# Patient Record
Sex: Male | Born: 1935 | Race: White | Hispanic: No | Marital: Married | Smoking: Former smoker
Health system: Southern US, Community
[De-identification: ages and names within clinical notes are randomized; demographics above are authoritative.]

## PROBLEM LIST (undated history)

## (undated) DIAGNOSIS — I509 Heart failure, unspecified: Secondary | ICD-10-CM

## (undated) DIAGNOSIS — D649 Anemia, unspecified: Secondary | ICD-10-CM

## (undated) DIAGNOSIS — I4891 Unspecified atrial fibrillation: Secondary | ICD-10-CM

## (undated) DIAGNOSIS — E785 Hyperlipidemia, unspecified: Secondary | ICD-10-CM

## (undated) DIAGNOSIS — I6529 Occlusion and stenosis of unspecified carotid artery: Secondary | ICD-10-CM

## (undated) DIAGNOSIS — I2510 Atherosclerotic heart disease of native coronary artery without angina pectoris: Secondary | ICD-10-CM

## (undated) DIAGNOSIS — C801 Malignant (primary) neoplasm, unspecified: Secondary | ICD-10-CM

## (undated) DIAGNOSIS — I10 Essential (primary) hypertension: Secondary | ICD-10-CM

## (undated) DIAGNOSIS — K219 Gastro-esophageal reflux disease without esophagitis: Secondary | ICD-10-CM

## (undated) HISTORY — PX: TONSILLECTOMY AND ADENOIDECTOMY: SUR1326

## (undated) HISTORY — DX: Occlusion and stenosis of unspecified carotid artery: I65.29

## (undated) HISTORY — DX: Unspecified atrial fibrillation: I48.91

## (undated) HISTORY — DX: Malignant (primary) neoplasm, unspecified: C80.1

## (undated) HISTORY — DX: Heart failure, unspecified: I50.9

## (undated) HISTORY — DX: Gastro-esophageal reflux disease without esophagitis: K21.9

## (undated) HISTORY — DX: Anemia, unspecified: D64.9

## (undated) HISTORY — DX: Hyperlipidemia, unspecified: E78.5

## (undated) HISTORY — DX: Essential (primary) hypertension: I10

## (undated) HISTORY — DX: Atherosclerotic heart disease of native coronary artery without angina pectoris: I25.10

## (undated) HISTORY — PX: OTHER SURGICAL HISTORY: SHX169

## (undated) HISTORY — PX: SKIN CANCER EXCISION: SHX779

---
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
## 1992-10-23 HISTORY — PX: PROSTATE SURGERY: SHX751

## 2011-08-29 ENCOUNTER — Ambulatory Visit: Payer: Self-pay | Admitting: Internal Medicine

## 2011-09-08 ENCOUNTER — Ambulatory Visit: Payer: Self-pay | Admitting: Internal Medicine

## 2011-09-19 ENCOUNTER — Ambulatory Visit: Payer: Self-pay | Admitting: Internal Medicine

## 2012-03-19 ENCOUNTER — Other Ambulatory Visit: Payer: Self-pay | Admitting: Internal Medicine

## 2012-03-19 NOTE — Telephone Encounter (Signed)
New Patient appt on June 19th.  Please advise.

## 2012-04-10 ENCOUNTER — Encounter: Payer: Self-pay | Admitting: Internal Medicine

## 2012-04-10 ENCOUNTER — Ambulatory Visit (INDEPENDENT_AMBULATORY_CARE_PROVIDER_SITE_OTHER): Payer: Medicare Other | Admitting: Internal Medicine

## 2012-04-10 VITALS — BP 112/72 | HR 107 | Temp 98.0°F | Resp 14 | Wt 153.8 lb

## 2012-04-10 DIAGNOSIS — I4891 Unspecified atrial fibrillation: Secondary | ICD-10-CM

## 2012-04-10 DIAGNOSIS — I10 Essential (primary) hypertension: Secondary | ICD-10-CM | POA: Insufficient documentation

## 2012-04-10 DIAGNOSIS — E785 Hyperlipidemia, unspecified: Secondary | ICD-10-CM

## 2012-04-10 DIAGNOSIS — Z79899 Other long term (current) drug therapy: Secondary | ICD-10-CM

## 2012-04-10 MED ORDER — DILTIAZEM HCL ER 120 MG PO CP12: 120.0000 mg | ORAL_CAPSULE | Freq: Two times a day (BID) | ORAL | Status: DC

## 2012-04-10 NOTE — Patient Instructions (Addendum)
If you have another episode of nausea, we should rule out  UTI Boyd Kerbs should be able to obtain a UA/Culture  Return in 6 months

## 2012-04-10 NOTE — Progress Notes (Signed)
Patient ID: Bruce Vaughan, male   DOB: [birth year], 78 y.o.   MRN: [id number]  Patient Active Problem List  Diagnosis  . Hyperlipidemia  . Hypertension  . Atrial fibrillation    Subjective:  CC:   Chief Complaint  Patient presents with  . New Patient    HPI:   Bruce Vaughan is a 78 y.o. male who presents as a new patient to establish primary care .  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  Has OA and kyphosis , uses a walker for rest.  NO recent falls,  no history of recent fractures, last fall occurred while vacationing in [place] last year and fractured a metatarsal and a finger when he fell off the curb. Has atrial fibrillation diagnosed 3 years ago , and monitors INR with weekly home checks , makes his own changes to coumadin changes.  His cardiologist is  in [place]  His currentdose is 4 mg daily .  Had 2 episodes of nocturnal emesis each accompanied by nausea, each resolved after one episode. m Ha snot hade any in weeks.  Takes a PPI.  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  Code status discussed , he wishes to remain  FULL CODE.  He has  stable carotid stenosis, with prior serials scans and surgery  offered but declined. On warfarin and statin.,  mitral valve .  Active ,  No exertional dyspnea with current heart rate. Does water aerobics 3 times weekly swims for 20 minutes 3 times weekly.    Past Medical History  Diagnosis Date  . Hyperlipidemia   . Hypertension   . Atrial fibrillation   . prostate Cancer     Past Surgical History  Procedure Date  . Thyroidectomy     subtotal ,  nonmalignant tumor  . Tonsillectomy and adenoidectomy   . Appendectomy 1930  . Prostate surgery 1994    radical, annual PSAs low    History reviewed. No pertinent family history.  History   Social History  . Marital Status: Married    Spouse Name: N/A    Number of Children: N/A  . Years of  Education: N/A   Occupational History  . Not on file.   Social History Main Topics  . Smoking status: Former Smoker    Types: Cigarettes    Quit date: 04/10/1982  . Smokeless tobacco: Never Used  . Alcohol Use: Yes  . Drug Use: No  . Sexually Active: Not on file   Other Topics Concern  . Not on file   Social History Narrative  . No narrative on file         @ALLHX @    Review of Systems:   The remainder of the review of systems was negative except those addressed in the HPI.       Objective:  BP 112/72  Pulse 107  Temp 98 F (36.7 C) (Oral)  Resp 14  Wt 153 lb 12 oz (69.741 kg)  SpO2 99%  General appearance: alert, cooperative and appears stated age Ears: normal TM's and external ear canals both ears Throat: lips, mucosa, and tongue normal; teeth and gums normal Neck: no adenopathy, no carotid bruit, supple, symmetrical, trachea midline and thyroid not enlarged, symmetric, no tenderness/mass/nodules Back: symmetric, no curvature. ROM normal. No CVA tenderness. Lungs: clear to auscultation bilaterally Heart: regular rate and rhythm, S1, S2 normal, no murmur, click, rub or  gallop Abdomen: soft, non-tender; bowel sounds normal; no masses,  no organomegaly Pulses: 2+ and symmetric Skin: Skin color, texture, turgor normal. No rashes or lesions Lymph nodes: Cervical, supraclavicular, and axillary nodes normal.  Assessment and Plan:  Hypertension Well controlled on current regimen. Renal function stable, no changes today.  Atrial fibrillation We discucssed his rate control.  He is tolerating his current pulse rate without edema or exercise intolerance .  No changes today  Hyperlipidemia Managed with simvastatin. Marland Kitchen  He is due for LFTs  And repeat panel.  Last LDL was 66 in April 2012   Updated Medication List Outpatient Encounter Prescriptions as of 04/10/2012  Medication Sig Dispense Refill  . diltiazem (CARDIZEM SR) 120 MG 12 hr capsule Take 120 mg by  mouth daily.      . ferrous gluconate (FERGON) 246 (28 FE) MG tablet Take 246 mg by mouth daily with breakfast.      . hydrochlorothiazide (HYDRODIURIL) 25 MG tablet Take 25 mg by mouth daily.      Marland Kitchen lisinopril (PRINIVIL,ZESTRIL) 5 MG tablet Take 5 mg by mouth daily.      . Misc Natural Products (FIBER 7 PO) Take by mouth 2 (two) times daily.      . Multiple Vitamin (MULTIVITAMIN) tablet Take 1 tablet by mouth daily.      . niacin (SLO-NIACIN) 500 MG tablet Take 750 mg by mouth 2 (two) times daily with a meal.      . pantoprazole (PROTONIX) 40 MG tablet Take 40 mg by mouth daily.      . simvastatin (ZOCOR) 40 MG tablet Take 40 mg by mouth every evening.      . warfarin (COUMADIN) 4 MG tablet Take 4 mg by mouth daily.      Marland Kitchen diltiazem (CARDIZEM SR) 120 MG 12 hr capsule Take 1 capsule (120 mg total) by mouth 2 (two) times daily.  90 capsule  3     Orders Placed This Encounter  Procedures  . COMPLETE METABOLIC PANEL WITH GFR  . Direct LDL    No Follow-up on file.

## 2012-04-11 LAB — COMPLETE METABOLIC PANEL WITH GFR
ALT: 18 U/L (ref 0–53)
AST: 26 U/L (ref 0–37)
CO2: 28 mEq/L (ref 19–32)
Calcium: 10 mg/dL (ref 8.4–10.5)
Chloride: 100 mEq/L (ref 96–112)
GFR, Est African American: 60 mL/min
Potassium: 4.2 mEq/L (ref 3.5–5.3)
Sodium: 139 mEq/L (ref 135–145)
Total Protein: 7.2 g/dL (ref 6.0–8.3)

## 2012-04-13 ENCOUNTER — Encounter: Payer: Self-pay | Admitting: Internal Medicine

## 2012-04-13 DIAGNOSIS — I6529 Occlusion and stenosis of unspecified carotid artery: Secondary | ICD-10-CM | POA: Insufficient documentation

## 2012-04-13 NOTE — Assessment & Plan Note (Signed)
Managed with simvastatin. Bruce Vaughan  He is due for LFTs  And repeat panel.  Last LDL was 66 in April 2012

## 2012-04-13 NOTE — Assessment & Plan Note (Signed)
Well controlled on current regimen. Renal function stable, no changes today. 

## 2012-04-13 NOTE — Assessment & Plan Note (Signed)
We discucssed his rate control.  He is tolerating his current pulse rate without edema or exercise intolerance .  No changes today

## 2012-05-13 ENCOUNTER — Other Ambulatory Visit: Payer: Medicare Other | Admitting: *Deleted

## 2012-05-13 DIAGNOSIS — R17 Unspecified jaundice: Secondary | ICD-10-CM

## 2012-05-13 LAB — HEPATIC FUNCTION PANEL
ALT: 20 U/L (ref 0–53)
AST: 24 U/L (ref 0–37)
Albumin: 4.2 g/dL (ref 3.5–5.2)
Alkaline Phosphatase: 63 U/L (ref 39–117)
Total Bilirubin: 1.8 mg/dL — ABNORMAL HIGH (ref 0.3–1.2)

## 2012-06-07 ENCOUNTER — Telehealth: Payer: Self-pay | Admitting: Internal Medicine

## 2012-06-07 NOTE — Telephone Encounter (Signed)
If he is feeling fine, he does not need to see me,  Just have the EKG.  Have him come on Thursday afternoon when Morrie Sheldon is not so busy

## 2012-06-07 NOTE — Telephone Encounter (Signed)
Nurse visit scheduled.

## 2012-06-07 NOTE — Telephone Encounter (Signed)
Patient called and stated on his wife's AVS from last week you hand wrote on there for him to come back in and have an EKG done.  Do you want to see if in an office visit?  Please advise.

## 2012-06-13 ENCOUNTER — Other Ambulatory Visit: Payer: Self-pay | Admitting: Internal Medicine

## 2012-06-13 MED ORDER — SIMVASTATIN 40 MG PO TABS: 40.0000 mg | ORAL_TABLET | Freq: Every evening | ORAL | Status: AC

## 2012-06-20 ENCOUNTER — Ambulatory Visit (INDEPENDENT_AMBULATORY_CARE_PROVIDER_SITE_OTHER): Payer: Medicare Other | Admitting: Internal Medicine

## 2012-06-20 DIAGNOSIS — I4891 Unspecified atrial fibrillation: Secondary | ICD-10-CM

## 2012-08-01 ENCOUNTER — Telehealth: Payer: Self-pay | Admitting: Internal Medicine

## 2012-08-01 ENCOUNTER — Inpatient Hospital Stay: Payer: Self-pay | Admitting: Family Medicine

## 2012-08-01 LAB — CBC
HGB: 14.3 g/dL (ref 13.0–18.0)
MCHC: 35.3 g/dL (ref 32.0–36.0)
Platelet: 163 10*3/uL (ref 150–440)
WBC: 8.5 10*3/uL (ref 3.8–10.6)

## 2012-08-01 LAB — COMPREHENSIVE METABOLIC PANEL
Albumin: 4.1 g/dL (ref 3.4–5.0)
Alkaline Phosphatase: 87 U/L (ref 50–136)
Anion Gap: 11 (ref 7–16)
Calcium, Total: 9.2 mg/dL (ref 8.5–10.1)
Potassium: 3.6 mmol/L (ref 3.5–5.1)
SGOT(AST): 37 U/L (ref 15–37)
SGPT (ALT): 33 U/L (ref 12–78)
Sodium: 139 mmol/L (ref 136–145)
Total Protein: 7.8 g/dL (ref 6.4–8.2)

## 2012-08-01 LAB — PRO B NATRIURETIC PEPTIDE: B-Type Natriuretic Peptide: 14744 pg/mL — ABNORMAL HIGH (ref 0–450)

## 2012-08-01 LAB — URINALYSIS, COMPLETE
Ketone: NEGATIVE
Leukocyte Esterase: NEGATIVE
Ph: 6 (ref 4.5–8.0)
Protein: 100
RBC,UR: 1 /HPF (ref 0–5)

## 2012-08-01 LAB — CK TOTAL AND CKMB (NOT AT ARMC)
CK, Total: 86 U/L (ref 35–232)
CK-MB: 2.3 ng/mL (ref 0.5–3.6)
CK-MB: 2.4 ng/mL (ref 0.5–3.6)

## 2012-08-01 LAB — TROPONIN I
Troponin-I: 0.42 ng/mL — ABNORMAL HIGH
Troponin-I: 0.4 ng/mL — ABNORMAL HIGH

## 2012-08-01 LAB — PROTIME-INR: Prothrombin Time: 23.6 secs — ABNORMAL HIGH (ref 11.5–14.7)

## 2012-08-01 NOTE — Telephone Encounter (Signed)
Caller: Bruce Vaughan/Patient; Phone: [phone]; Reason for Call: Caller: Bruce Vaughan/Patient; Patient Name: Bruce Vaughan; PCP: Duncan Dull (Adults only); Best Callback Phone Number: [phone]; Reason for call: New onset of weakness and lightheadedness.  Pt.  Admits to mild Chest discomfort as well.  Pt.  Has hx.  Of A fib.  And takes coumadin.  No active bleeding.  Triaged per Weakness or Paralysis, and the Disposition became: 911 for Pressure, fullness, squeezing sensation or pain anywhere in the chest lasting 5 or more minutes now.  THE PATIENT REFUSED 911.  Pt.  Does agree to go to ED now, with his wife driving him.  Care instructions given.

## 2012-08-02 DIAGNOSIS — R5383 Other fatigue: Secondary | ICD-10-CM

## 2012-08-02 DIAGNOSIS — I059 Rheumatic mitral valve disease, unspecified: Secondary | ICD-10-CM

## 2012-08-02 DIAGNOSIS — R5381 Other malaise: Secondary | ICD-10-CM

## 2012-08-02 DIAGNOSIS — R7989 Other specified abnormal findings of blood chemistry: Secondary | ICD-10-CM

## 2012-08-02 LAB — CK TOTAL AND CKMB (NOT AT ARMC)
CK, Total: 82 U/L (ref 35–232)
CK-MB: 1.9 ng/mL (ref 0.5–3.6)

## 2012-08-02 LAB — LIPID PANEL
Cholesterol: 108 mg/dL (ref 0–200)
Ldl Cholesterol, Calc: 47 mg/dL (ref 0–100)

## 2012-08-03 DIAGNOSIS — I5041 Acute combined systolic (congestive) and diastolic (congestive) heart failure: Secondary | ICD-10-CM

## 2012-08-03 LAB — BASIC METABOLIC PANEL
Chloride: 99 mmol/L (ref 98–107)
Co2: 31 mmol/L (ref 21–32)
Creatinine: 1.45 mg/dL — ABNORMAL HIGH (ref 0.60–1.30)
EGFR (African American): 49 — ABNORMAL LOW
EGFR (Non-African Amer.): 42 — ABNORMAL LOW
Glucose: 99 mg/dL (ref 65–99)
Osmolality: 285 (ref 275–301)
Potassium: 3.1 mmol/L — ABNORMAL LOW (ref 3.5–5.1)

## 2012-08-03 LAB — TSH: Thyroid Stimulating Horm: 1.41 u[IU]/mL

## 2012-08-04 LAB — BASIC METABOLIC PANEL
Anion Gap: 9 (ref 7–16)
BUN: 30 mg/dL — ABNORMAL HIGH (ref 7–18)
Calcium, Total: 8.7 mg/dL (ref 8.5–10.1)
Co2: 28 mmol/L (ref 21–32)
Creatinine: 1.49 mg/dL — ABNORMAL HIGH (ref 0.60–1.30)
EGFR (African American): 48 — ABNORMAL LOW
EGFR (Non-African Amer.): 41 — ABNORMAL LOW
Glucose: 96 mg/dL (ref 65–99)
Osmolality: 287 (ref 275–301)

## 2012-08-05 ENCOUNTER — Telehealth: Payer: Self-pay | Admitting: Internal Medicine

## 2012-08-05 ENCOUNTER — Telehealth: Payer: Self-pay

## 2012-08-05 NOTE — Telephone Encounter (Signed)
TCM call I spoke with patient. He reports feeling better since hosp d/c for CHF exacerbation.  He denies worsening sob or edema. He continues to monitor coumadin with home machine and calls results to [facility] (used to manage him there).  Confirmed he has all meds needed per d/c list.  All meds have been filled/refilled. Appt made with G[facility]an for 08/08/12 at 11:15. Understanding verb.

## 2012-08-05 NOTE — Telephone Encounter (Signed)
TCM call

## 2012-08-05 NOTE — Telephone Encounter (Signed)
Patient was discharged from [facility] on 08/04/12.  I called and spoke with patient, he states he is feeling better and has to follow up with Dr. Mariah Milling on Thursday.  I asked him could we go ahead and make a follow up appointment with Dr. Darrick Huntsman and he asked that we wait a bit. He will call us back to schedule a follow up appointment.  He said that he went into the hospital with congestive heart failure.

## 2012-08-05 NOTE — Telephone Encounter (Signed)
Message copied by Marcelle Overlie on Mon Aug 05, 2012  9:29 AM ------      Message from: Oneida Arenas      Created: Mon Aug 05, 2012  9:22 AM      Regarding: tcm patient       Pt at [facility]

## 2012-08-08 ENCOUNTER — Ambulatory Visit (INDEPENDENT_AMBULATORY_CARE_PROVIDER_SITE_OTHER): Payer: Medicare Other | Admitting: Cardiovascular Disease

## 2012-08-08 ENCOUNTER — Encounter: Payer: Self-pay | Admitting: Cardiovascular Disease

## 2012-08-08 VITALS — BP 98/58 | HR 88 | Ht 74.0 in | Wt 153.0 lb

## 2012-08-08 DIAGNOSIS — I5041 Acute combined systolic (congestive) and diastolic (congestive) heart failure: Secondary | ICD-10-CM

## 2012-08-08 DIAGNOSIS — I4891 Unspecified atrial fibrillation: Secondary | ICD-10-CM

## 2012-08-08 DIAGNOSIS — E785 Hyperlipidemia, unspecified: Secondary | ICD-10-CM

## 2012-08-08 DIAGNOSIS — I2510 Atherosclerotic heart disease of native coronary artery without angina pectoris: Secondary | ICD-10-CM

## 2012-08-08 DIAGNOSIS — I10 Essential (primary) hypertension: Secondary | ICD-10-CM

## 2012-08-08 DIAGNOSIS — I6529 Occlusion and stenosis of unspecified carotid artery: Secondary | ICD-10-CM

## 2012-08-08 MED ORDER — FUROSEMIDE 20 MG PO TABS: 20.0000 mg | ORAL_TABLET | Freq: Every day | ORAL | Status: DC | PRN

## 2012-08-08 MED ORDER — DILTIAZEM HCL ER COATED BEADS 120 MG PO CP24: 120.0000 mg | ORAL_CAPSULE | Freq: Every day | ORAL | Status: DC

## 2012-08-08 MED ORDER — DIGOXIN 125 MCG PO TABS: 0.1250 mg | ORAL_TABLET | Freq: Every day | ORAL | Status: DC

## 2012-08-08 MED ORDER — POTASSIUM CHLORIDE ER 10 MEQ PO CPCR: 10.0000 meq | ORAL_CAPSULE | Freq: Every day | ORAL | Status: DC

## 2012-08-08 NOTE — Progress Notes (Signed)
Patient ID: Bruce Vaughan, male    DOB: 10/20/1921, 78 y.o.   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 He was diagnosed with acute systolic and diastolic CHF and poor heart rate control.  Digoxin was added for rate control, he was started on diuretics. Initial BNP was 14,000. Heart rate 100 to 120 on a regular basis, worse with exertion. With diuresis, his shortness of breath improved, lungs were clear and he was discharged with followup today.  Echocardiogram in the hospital showed ejection fraction 35-45%, mildly dilated left atrium, mild to moderate MR, mild to moderate TR, right ventricular systolic pressure 40-50 mmHg, moderate sized left pleural effusion he had baseline renal dysfunction in the hospital with creatinine 1.31 on arrival, troponin 0.4  Since his discharge, he reports that his fatigue is improved, no significant shortness of breath and he feels about his baseline. He is taking Lasix daily with low-dose potassium, digoxin daily. HCTZ was held. He denies any significant lightheadedness or dizziness but blood pressures running low  EKG in the hospital showed atrial fibrillation with right bundle branch block, left anterior fascicular block, rate 110 beats per minute  EKG today shows atrial fibrillation with rate 88 beats per minute, right bundle branch block, left anterior fascicular block, rare PVCs.     Outpatient Encounter Prescriptions as of 08/08/2012  Medication Sig Dispense Refill  . digoxin (LANOXIN) 0.125 MG tablet Take 1 tablet (0.125 mg total) by mouth daily.  90 tablet  3  . ferrous gluconate (FERGON) 324 MG tablet Take 324  mg by mouth daily with breakfast.      . furosemide (LASIX) 20 MG tablet Take 1 tablet (20 mg total) by mouth daily as needed.  90 tablet  3  . Misc Natural Products (FIBER 7 PO) Take by mouth daily.       . Multiple Vitamin (MULTIVITAMIN) tablet Take 1 tablet by mouth daily.      . niacin (NIASPAN) 750 MG CR tablet Take 750 mg by mouth at bedtime.      . niacin (NIASPAN) 750 MG CR tablet Take 750 mg by mouth daily.      . Nutritional Supplements (CHOICE DM FIBER-BURST PO) Take 1.5 g by mouth daily.      . pantoprazole (PROTONIX) 40 MG tablet Take 40 mg by mouth daily.      . potassium chloride (MICRO-K) 10 MEQ CR capsule Take 1 capsule (10 mEq total) by mouth daily.  90 capsule  4  . simvastatin (ZOCOR) 40 MG tablet Take 1 tablet (40 mg total) by mouth every evening.  90 tablet  3  . warfarin (COUMADIN) 4 MG tablet Take 4 mg by mouth daily.      Marland Kitchen DISCONTD: digoxin (LANOXIN) 0.125 MG tablet Take 0.125 mg by mouth daily.      Marland Kitchen DISCONTD: diltiazem (CARDIZEM SR) 120 MG 12 hr capsule Take 120 mg by mouth daily.      Marland Kitchen DISCONTD: furosemide (LASIX) 20 MG tablet Take 20 mg by mouth daily.      Marland Kitchen  lisinopril (PRINIVIL,ZESTRIL) 5 MG tablet Take 5 mg by mouth daily.      Marland Kitchen diltiazem (CARDIZEM CD) 120 MG 24 hr  capsule Take 1 capsule (120 mg total) by mouth daily.  90 capsule  3     Review of Systems  Constitutional: Negative.   HENT: Negative.   Eyes: Negative.   Respiratory: Negative.   Cardiovascular: Negative.   Gastrointestinal: Negative.   Musculoskeletal: Negative.   Skin: Negative.   Neurological: Negative.   Hematological: Negative.   Psychiatric/Behavioral: Negative.   All other systems reviewed and are negative.    BP 98/58  Pulse 88  Ht 6\' 2"  (1.88 m)  Wt 153 lb (69.4 kg)  BMI 19.64 kg/m2  Physical Exam  Nursing note and vitals reviewed. Constitutional: He is oriented to person, place, and time. He appears well-developed and well-nourished.  HENT:  Head: Normocephalic.    Nose: Nose normal.  Mouth/Throat: Oropharynx is clear and moist.  Eyes: Conjunctivae normal are normal. Pupils are equal, round, and reactive to light.  Neck: Normal range of motion. Neck supple. No JVD present.  Cardiovascular: Normal rate, regular rhythm, normal heart sounds and intact distal pulses.  Exam reveals no gallop and no friction rub.   No murmur heard. Pulmonary/Chest: Effort normal and breath sounds normal. No respiratory distress. He has no wheezes. He has no rales. He exhibits no tenderness.  Abdominal: Soft. Bowel sounds are normal. He exhibits no distension. There is no tenderness.  Musculoskeletal: Normal range of motion. He exhibits no edema and no tenderness.  Lymphadenopathy:    He has no cervical adenopathy.  Neurological: He is alert and oriented to person, place, and time. Coordination normal.  Skin: Skin is warm and dry. No rash noted. No erythema.  Psychiatric: He has a normal mood and affect. His behavior is normal. Judgment and thought content normal.           Assessment and Plan

## 2012-08-08 NOTE — Assessment & Plan Note (Signed)
Blood pressure is running low. We'll hold his lisinopril. No dizziness but he is concerned.

## 2012-08-08 NOTE — Assessment & Plan Note (Signed)
Chronic atrial fibrillation. Rate is improved on digoxin and diltiazem. We'll check a digoxin level today

## 2012-08-08 NOTE — Assessment & Plan Note (Signed)
Pleural effusion seems to have significantly improved with diuresis. Fatigue has improved. We will continue Lasix daily for now. He may benefit from repeat basic metabolic panel in the future. We have suggested he could skip doses of Lasix as needed for dry mouth, dry skin, low blood pressure or weight loss. He will take potassium only when he takes Lasix.

## 2012-08-08 NOTE — Patient Instructions (Addendum)
Please adjust lasix and potassium as you need Continue digoxin daily  Hold the lisinopril  We will check renal function, potassium and digoxin today  Please call us if you have new issues that need to be addressed before your next appt.  Your physician wants you to follow-up in: 6 weeks

## 2012-08-08 NOTE — Assessment & Plan Note (Signed)
Cholesterol is at goal on the current lipid regimen. No changes to the medications were made.  

## 2012-08-08 NOTE — Assessment & Plan Note (Signed)
Continue warfarin and statin. He's not interested in surgical options. We could discuss percutaneous stenting at a later date.

## 2012-08-09 LAB — BASIC METABOLIC PANEL
GFR calc Af Amer: 55 mL/min/{1.73_m2} — ABNORMAL LOW (ref >59)
GFR calc non Af Amer: 48 mL/min/{1.73_m2} — ABNORMAL LOW (ref >59)
Glucose: 127 mg/dL — ABNORMAL HIGH (ref 65–99)
Potassium: 4.6 mmol/L (ref 3.5–5.2)

## 2012-08-09 LAB — DIGOXIN LEVEL: Digoxin Level: 1.2 ng/mL (ref 0.9–2.0)

## 2012-08-12 ENCOUNTER — Encounter: Payer: Self-pay | Admitting: Internal Medicine

## 2012-08-12 ENCOUNTER — Ambulatory Visit (INDEPENDENT_AMBULATORY_CARE_PROVIDER_SITE_OTHER): Payer: Medicare Other | Admitting: Internal Medicine

## 2012-08-12 VITALS — BP 118/68 | HR 91 | Temp 97.6°F | Ht 73.0 in | Wt 152.0 lb

## 2012-08-12 DIAGNOSIS — Z79899 Other long term (current) drug therapy: Secondary | ICD-10-CM

## 2012-08-12 DIAGNOSIS — I5041 Acute combined systolic (congestive) and diastolic (congestive) heart failure: Secondary | ICD-10-CM

## 2012-08-12 DIAGNOSIS — I509 Heart failure, unspecified: Secondary | ICD-10-CM

## 2012-08-12 DIAGNOSIS — E785 Hyperlipidemia, unspecified: Secondary | ICD-10-CM

## 2012-08-12 DIAGNOSIS — I10 Essential (primary) hypertension: Secondary | ICD-10-CM

## 2012-08-12 DIAGNOSIS — I4891 Unspecified atrial fibrillation: Secondary | ICD-10-CM

## 2012-08-12 NOTE — Patient Instructions (Addendum)
If your potassium level is normal  (4.0) we will continue using potassium only when taking a dose of furosemide.

## 2012-08-12 NOTE — Progress Notes (Signed)
Patient ID: Bruce Vaughan, male   DOB: [birth year], 78 y.o.   MRN: [id number]  Patient Active Problem List  Diagnosis  . Hyperlipidemia  . Hypertension  . Atrial fibrillation  . Carotid stenosis, non-symptomatic  . Acute combined systolic and diastolic CHF, NYHA class 1    Subjective:  CC:   Chief Complaint  Patient presents with  . Follow-up    HPI:   Bruce Vaughan a 78 y.o. male who presents  With recent episode of decompensated systolic Congestive heart failure. 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  He presented with  symptoms of fatigue which were due to volume overload. He had tried to make contact with the office because he thought his hemoglobin may have dropped but had considerable difficulty making contact because the phone tree kept disconnecting him.  He was finally advised by the triage nurse to go to the ER at [facility] which he did and he was promptly admitted. His BNP was 14,000.  hemoglobin was normal. Chest x-ray showed pulmonary vascular congestion. Echo was done which showed an EF of 35% and a pleural effusion on the left. He was diuresed with IV Lasix. Lasix dose was reduced to 40 mg daily at discharge due to slight bump in creatinine. He had his hospital followup with Dr. Mariah Milling  recently and his medications were adjusted. He is taking digoxin and  Cardizem for rate control of his atrial fibrillation. He ruled out for acute MI.  He is weighing  self daily  Using lasix now prn now and supplementing potassium whenever he takes the Lasix. He has used Lasix twice in the last 4 days.  Renal function was back to baseline as of October 17, but his potassium was slightly elevated.  He remains very annoyed by the difficulty he had making contact with a person at our office when he needed to be seen..   Past Medical History  Diagnosis Date  . Hyperlipidemia   . Hypertension   . Atrial fibrillation   . prostate Cancer   . Carotid stenosis, non-symptomatic   .  Coronary artery disease     s/p attemp to place cardiac stent, however was not successful.  . Chronic anemia   . GERD (gastroesophageal reflux disease)     Past Surgical History  Procedure Date  . Thyroidectomy     subtotal ,  nonmalignant tumor  . Tonsillectomy and adenoidectomy   . Appendectomy 1930  . Prostate surgery 1994    radical, annual PSAs low  . Skin cancer excision   . Partial thyroid res          The following portions of the patient's history were reviewed and updated as appropriate: Allergies, current medications, and problem list.    Review of Systems:   12 Pt  review of systems was negative except those addressed in the HPI,     History   Social History  . Marital Status: Married    Spouse Name: N/A    Number of Children: N/A  . Years of Education: N/A   Occupational History  . Not on file.   Social History Main Topics  . Smoking status: Former Smoker    Types: Cigarettes    Quit date: 04/10/1982  . Smokeless tobacco: Never Used  . Alcohol Use: Yes  . Drug Use: No  . Sexually Active: Not on file   Other Topics Concern  . Not on file   Social History Narrative  . No narrative  on file    Objective:  BP 118/68  Pulse 91  Temp 97.6 F (36.4 C) (Oral)  Ht 6\' 1"  (1.854 m)  Wt 152 lb (68.947 kg)  BMI 20.05 kg/m2  SpO2 97%  General appearance: alert, cooperative and appears stated age Ears: normal TM's and external ear canals both ears Throat: lips, mucosa, and tongue normal; teeth and gums normal Neck: no adenopathy, no carotid bruit, supple, symmetrical, trachea midline and thyroid not enlarged, symmetric, no tenderness/mass/nodules Back: symmetric, no curvature. ROM normal. No CVA tenderness. Lungs: clear to auscultation bilaterally Heart: regular rate and rhythm, S1, S2 normal, no murmur, click, rub or gallop Abdomen: soft, non-tender; bowel sounds normal; no masses,  no organomegaly Pulses: 2+ and symmetric Skin: Skin  color, texture, turgor normal. No rashes or lesions Lymph nodes: Cervical, supraclavicular, and axillary nodes normal.  Assessment and Plan:  Hypertension His medications were adjusted last week for hypotension. His blood pressure is improved today. No changes. Repeat of renal function and potassium today.  Hyperlipidemia Managed with simvastatin and niacin. He has carotid stenosis, asymptomatic.  Acute combined systolic and diastolic CHF, NYHA class 1 He is on digoxin but is not on a beta blocker for unclear reasons. We'll discuss with his cardiologist whether we should make a switch from diltiazem to metoprolol given his concurrent atrial fibrillation.  Atrial fibrillation Currently rate controlled with diltiazem, but given his cardiomyopathy and recent hospitalization for heart failure question I discussed with him the need to put him on a beta blocker. He was reluctant to do this since he has been on diltiazem for years. Will discuss with Dr. Mariah Milling.    Updated Medication List Outpatient Encounter Prescriptions as of 08/12/2012  Medication Sig Dispense Refill  . digoxin (LANOXIN) 0.125 MG tablet Take 1 tablet (0.125 mg total) by mouth daily.  90 tablet  3  . diltiazem (CARDIZEM CD) 120 MG 24 hr capsule Take 1 capsule (120 mg total) by mouth daily.  90 capsule  3  . ferrous gluconate (FERGON) 324 MG tablet Take 324 mg by mouth daily with breakfast.      . furosemide (LASIX) 20 MG tablet Take 1 tablet (20 mg total) by mouth daily as needed.  90 tablet  3  . Misc Natural Products (FIBER 7 PO) Take by mouth daily.       . Multiple Vitamin (MULTIVITAMIN) tablet Take 1 tablet by mouth daily.      . niacin (NIASPAN) 750 MG CR tablet Take 750 mg by mouth at bedtime.      . niacin (NIASPAN) 750 MG CR tablet Take 750 mg by mouth daily.      . Nutritional Supplements (CHOICE DM FIBER-BURST PO) Take 1.5 g by mouth daily.      . pantoprazole (PROTONIX) 40 MG tablet Take 40 mg by mouth daily.       . potassium chloride (MICRO-K) 10 MEQ CR capsule Take 1 capsule (10 mEq total) by mouth daily.  90 capsule  4  . simvastatin (ZOCOR) 40 MG tablet Take 1 tablet (40 mg total) by mouth every evening.  90 tablet  3  . warfarin (COUMADIN) 4 MG tablet Take 4 mg by mouth daily.         Orders Placed This Encounter  Procedures  . Basic metabolic panel    No Follow-up on file.

## 2012-08-13 ENCOUNTER — Encounter: Payer: Self-pay | Admitting: Internal Medicine

## 2012-08-13 ENCOUNTER — Telehealth: Payer: Self-pay | Admitting: Internal Medicine

## 2012-08-13 LAB — BASIC METABOLIC PANEL
Calcium: 9.5 mg/dL (ref 8.4–10.5)
Creatinine, Ser: 1.3 mg/dL (ref 0.4–1.5)

## 2012-08-13 NOTE — Assessment & Plan Note (Signed)
He is on digoxin but is not on a beta blocker for unclear reasons. We'll discuss with his cardiologist whether we should make a switch from diltiazem to metoprolol given his concurrent atrial fibrillation.

## 2012-08-13 NOTE — Telephone Encounter (Signed)
Pt says he received a message that his lab work was in but it wouldn't let him click on it to view it. He was very concerned about his Potassium levels and was wanting to know if someone could send it again through My Chart.

## 2012-08-13 NOTE — Assessment & Plan Note (Signed)
His medications were adjusted last week for hypotension. His blood pressure is improved today. No changes. Repeat of renal function and potassium today.

## 2012-08-13 NOTE — Assessment & Plan Note (Signed)
Currently rate controlled with diltiazem, but given his cardiomyopathy and recent hospitalization for heart failure question I discussed with him the need to put him on a beta blocker. He was reluctant to do this since he has been on diltiazem for years. Will discuss with Dr. Mariah Milling.

## 2012-08-13 NOTE — Assessment & Plan Note (Signed)
Managed with simvastatin and niacin. He has carotid stenosis, asymptomatic.

## 2012-08-14 ENCOUNTER — Encounter: Payer: Self-pay | Admitting: Internal Medicine

## 2012-08-14 ENCOUNTER — Encounter: Payer: Self-pay | Admitting: Cardiovascular Disease

## 2012-08-15 NOTE — Progress Notes (Signed)
He is reluctant to change anything I tried in the hospital and in the office. As he does appear to be doing better, I did not push the topic. He is very hands-on with his care, including his medications Will try to manage with diuretics, added low-dose digoxin for rate control.

## 2012-08-16 ENCOUNTER — Telehealth: Payer: Self-pay | Admitting: *Deleted

## 2012-08-16 NOTE — Telephone Encounter (Signed)
lmtcb re: home INR testing Mylo Red RN

## 2012-08-16 NOTE — Telephone Encounter (Signed)
Pt aware of lab results. He has not taken any lasix in the past 4-5 days. He checks his INR every Monday and this week it was 3.0  Dr. Mariah Milling is aware Mylo Red RN

## 2012-08-19 ENCOUNTER — Encounter: Payer: Self-pay | Admitting: Cardiovascular Disease

## 2012-08-19 LAB — LAB REPORT - SCANNED: INR: 2.5

## 2012-08-20 ENCOUNTER — Telehealth: Payer: Self-pay | Admitting: Internal Medicine

## 2012-08-20 ENCOUNTER — Encounter: Payer: Self-pay | Admitting: Internal Medicine

## 2012-08-20 NOTE — Telephone Encounter (Signed)
Patient called in states that he has deactivated himself from [facility] Chart and requesting new code. I explained to patient that I could only mail or he could pick up activation code but that he couldn't get over the phone or email.  So I have generated a code for patient so he can reactivate himself.  He would like to know what labs need to be drawn to check his potassium and could we order those test for the nurse [facility] to draw. Please advise patient.

## 2012-08-20 NOTE — Telephone Encounter (Signed)
Letter on printer for BMET order to send to [facility] [place] [facility] [place]

## 2012-08-21 ENCOUNTER — Other Ambulatory Visit: Payer: Self-pay

## 2012-08-21 NOTE — Telephone Encounter (Signed)
Form faxed to [facility]  [phone].

## 2012-08-23 ENCOUNTER — Encounter: Payer: Self-pay | Admitting: Internal Medicine

## 2012-08-24 ENCOUNTER — Encounter: Payer: Self-pay | Admitting: Internal Medicine

## 2012-08-25 ENCOUNTER — Telehealth: Payer: Self-pay | Admitting: Internal Medicine

## 2012-08-26 ENCOUNTER — Encounter: Payer: Self-pay | Admitting: Cardiovascular Disease

## 2012-08-26 LAB — LAB REPORT - SCANNED: INR: 3.4

## 2012-08-28 ENCOUNTER — Ambulatory Visit (INDEPENDENT_AMBULATORY_CARE_PROVIDER_SITE_OTHER): Payer: Medicare Other | Admitting: Cardiovascular Disease

## 2012-08-28 DIAGNOSIS — I4891 Unspecified atrial fibrillation: Secondary | ICD-10-CM

## 2012-08-28 DIAGNOSIS — Z7901 Long term (current) use of anticoagulants: Secondary | ICD-10-CM

## 2012-08-29 ENCOUNTER — Encounter: Payer: Self-pay | Admitting: Internal Medicine

## 2012-09-02 ENCOUNTER — Telehealth: Payer: Self-pay | Admitting: Internal Medicine

## 2012-09-02 ENCOUNTER — Other Ambulatory Visit: Payer: Self-pay | Admitting: *Deleted

## 2012-09-02 ENCOUNTER — Other Ambulatory Visit: Payer: Self-pay

## 2012-09-02 MED ORDER — DIGOXIN 125 MCG PO TABS: 0.1250 mg | ORAL_TABLET | Freq: Every day | ORAL | Status: DC

## 2012-09-02 MED ORDER — HYDROCHLOROTHIAZIDE 25 MG PO TABS: 25.0000 mg | ORAL_TABLET | Freq: Every day | ORAL | Status: DC

## 2012-09-02 NOTE — Telephone Encounter (Signed)
Refilled Digoxin. 

## 2012-09-02 NOTE — Telephone Encounter (Signed)
Refill request for Hydrochlorothiazide 25 mg # 30 5 R sent electronic to [facility] pharmacy

## 2012-09-02 NOTE — Telephone Encounter (Signed)
Refill request for hydrochlorothiazide 25 mg tab Sig: take 1 tablet daily

## 2012-09-03 DIAGNOSIS — I10 Essential (primary) hypertension: Secondary | ICD-10-CM

## 2012-09-03 DIAGNOSIS — I6529 Occlusion and stenosis of unspecified carotid artery: Secondary | ICD-10-CM

## 2012-09-03 DIAGNOSIS — I509 Heart failure, unspecified: Secondary | ICD-10-CM

## 2012-09-03 DIAGNOSIS — I5041 Acute combined systolic (congestive) and diastolic (congestive) heart failure: Secondary | ICD-10-CM

## 2012-09-03 DIAGNOSIS — E785 Hyperlipidemia, unspecified: Secondary | ICD-10-CM

## 2012-09-03 DIAGNOSIS — I4891 Unspecified atrial fibrillation: Secondary | ICD-10-CM

## 2012-09-04 ENCOUNTER — Ambulatory Visit (INDEPENDENT_AMBULATORY_CARE_PROVIDER_SITE_OTHER): Payer: Medicare Other | Admitting: Cardiovascular Disease

## 2012-09-04 ENCOUNTER — Encounter: Payer: Self-pay | Admitting: Cardiovascular Disease

## 2012-09-04 DIAGNOSIS — Z7901 Long term (current) use of anticoagulants: Secondary | ICD-10-CM

## 2012-09-04 DIAGNOSIS — I4891 Unspecified atrial fibrillation: Secondary | ICD-10-CM

## 2012-09-10 ENCOUNTER — Encounter: Payer: Self-pay | Admitting: Cardiovascular Disease

## 2012-09-10 LAB — LAB REPORT - SCANNED: INR: 2.8

## 2012-09-11 ENCOUNTER — Ambulatory Visit (INDEPENDENT_AMBULATORY_CARE_PROVIDER_SITE_OTHER): Payer: Medicare Other | Admitting: Cardiovascular Disease

## 2012-09-11 DIAGNOSIS — I4891 Unspecified atrial fibrillation: Secondary | ICD-10-CM

## 2012-09-11 DIAGNOSIS — Z7901 Long term (current) use of anticoagulants: Secondary | ICD-10-CM

## 2012-09-12 ENCOUNTER — Other Ambulatory Visit: Payer: Self-pay | Admitting: *Deleted

## 2012-09-12 ENCOUNTER — Other Ambulatory Visit: Payer: Self-pay

## 2012-09-12 ENCOUNTER — Encounter: Payer: Self-pay | Admitting: Internal Medicine

## 2012-09-12 MED ORDER — DIGOXIN 125 MCG PO TABS: 0.1250 mg | ORAL_TABLET | Freq: Every day | ORAL | Status: AC

## 2012-09-12 NOTE — Telephone Encounter (Signed)
Refilled Digoxin. 

## 2012-09-14 ENCOUNTER — Encounter: Payer: Self-pay | Admitting: Cardiovascular Disease

## 2012-09-16 ENCOUNTER — Other Ambulatory Visit: Payer: Self-pay

## 2012-09-16 MED ORDER — DILTIAZEM HCL ER COATED BEADS 120 MG PO CP24: 120.0000 mg | ORAL_CAPSULE | Freq: Every day | ORAL | Status: DC

## 2012-09-17 ENCOUNTER — Ambulatory Visit (INDEPENDENT_AMBULATORY_CARE_PROVIDER_SITE_OTHER): Payer: Medicare Other | Admitting: Cardiovascular Disease

## 2012-09-17 ENCOUNTER — Encounter: Payer: Self-pay | Admitting: Cardiovascular Disease

## 2012-09-17 DIAGNOSIS — I4891 Unspecified atrial fibrillation: Secondary | ICD-10-CM

## 2012-09-17 DIAGNOSIS — Z7901 Long term (current) use of anticoagulants: Secondary | ICD-10-CM

## 2012-09-22 ENCOUNTER — Encounter: Payer: Self-pay | Admitting: Cardiovascular Disease

## 2012-09-23 ENCOUNTER — Encounter: Payer: Self-pay | Admitting: Cardiovascular Disease

## 2012-09-23 ENCOUNTER — Telehealth: Payer: Self-pay | Admitting: Internal Medicine

## 2012-09-23 LAB — CBC
HCT: 37.6 % — ABNORMAL LOW (ref 40.0–52.0)
MCV: 102 fL — ABNORMAL HIGH (ref 80–100)
Platelet: 150 10*3/uL (ref 150–440)
RBC: 3.67 10*6/uL — ABNORMAL LOW (ref 4.40–5.90)
RDW: 16 % — ABNORMAL HIGH (ref 11.5–14.5)
WBC: 6.9 10*3/uL (ref 3.8–10.6)

## 2012-09-23 LAB — COMPREHENSIVE METABOLIC PANEL
Alkaline Phosphatase: 91 U/L (ref 50–136)
Anion Gap: 8 (ref 7–16)
Bilirubin,Total: 1.7 mg/dL — ABNORMAL HIGH (ref 0.2–1.0)
Chloride: 111 mmol/L — ABNORMAL HIGH (ref 98–107)
Co2: 25 mmol/L (ref 21–32)
Creatinine: 1.22 mg/dL (ref 0.60–1.30)
EGFR (Non-African Amer.): 52 — ABNORMAL LOW
Osmolality: 296 (ref 275–301)
Potassium: 4 mmol/L (ref 3.5–5.1)
SGPT (ALT): 27 U/L (ref 12–78)
Sodium: 144 mmol/L (ref 136–145)
Total Protein: 7 g/dL (ref 6.4–8.2)

## 2012-09-23 LAB — URINALYSIS, COMPLETE
Bacteria: NONE SEEN
Ketone: NEGATIVE
Nitrite: NEGATIVE
Ph: 7 (ref 4.5–8.0)
Protein: 100
RBC,UR: 228 /HPF (ref 0–5)
Specific Gravity: 1.027 (ref 1.003–1.030)
Squamous Epithelial: <1

## 2012-09-23 LAB — PROTIME-INR: INR: 2.6

## 2012-09-23 LAB — TROPONIN I: Troponin-I: 0.35 ng/mL — ABNORMAL HIGH

## 2012-09-23 LAB — DIGOXIN LEVEL: Digoxin: 1.18 ng/mL

## 2012-09-23 NOTE — Telephone Encounter (Signed)
Patient  notified of normal potassium level via phone

## 2012-09-23 NOTE — Telephone Encounter (Signed)
His Potassium is 4.4 (normal)

## 2012-09-24 ENCOUNTER — Inpatient Hospital Stay: Payer: Self-pay | Admitting: Internal Medicine

## 2012-09-24 DIAGNOSIS — I5043 Acute on chronic combined systolic (congestive) and diastolic (congestive) heart failure: Secondary | ICD-10-CM

## 2012-09-24 DIAGNOSIS — I4891 Unspecified atrial fibrillation: Secondary | ICD-10-CM

## 2012-09-24 LAB — CBC WITH DIFFERENTIAL/PLATELET
Basophil #: 0.1 10*3/uL (ref 0.0–0.1)
Eosinophil %: 0.7 %
Lymphocyte #: 2 10*3/uL (ref 1.0–3.6)
Lymphocyte %: 30.9 %
MCH: 35 pg — ABNORMAL HIGH (ref 26.0–34.0)
MCHC: 34 g/dL (ref 32.0–36.0)
MCV: 103 fL — ABNORMAL HIGH (ref 80–100)
Monocyte %: 8.9 %
Neutrophil %: 58.7 %
Platelet: 154 10*3/uL (ref 150–440)
RBC: 3.8 10*6/uL — ABNORMAL LOW (ref 4.40–5.90)
RDW: 16.1 % — ABNORMAL HIGH (ref 11.5–14.5)
WBC: 6.6 10*3/uL (ref 3.8–10.6)

## 2012-09-24 LAB — BASIC METABOLIC PANEL
Calcium, Total: 9.4 mg/dL (ref 8.5–10.1)
EGFR (African American): >60
EGFR (Non-African Amer.): 58 — ABNORMAL LOW
Osmolality: 295 (ref 275–301)
Sodium: 144 mmol/L (ref 136–145)

## 2012-09-25 ENCOUNTER — Telehealth: Payer: Self-pay

## 2012-09-25 ENCOUNTER — Ambulatory Visit (INDEPENDENT_AMBULATORY_CARE_PROVIDER_SITE_OTHER): Payer: Medicare Other | Admitting: Cardiovascular Disease

## 2012-09-25 DIAGNOSIS — I4891 Unspecified atrial fibrillation: Secondary | ICD-10-CM

## 2012-09-25 DIAGNOSIS — Z7901 Long term (current) use of anticoagulants: Secondary | ICD-10-CM

## 2012-09-25 LAB — BASIC METABOLIC PANEL
Anion Gap: 6 — ABNORMAL LOW (ref 7–16)
BUN: 31 mg/dL — ABNORMAL HIGH (ref 7–18)
Calcium, Total: 9.3 mg/dL (ref 8.5–10.1)
EGFR (African American): 55 — ABNORMAL LOW
EGFR (Non-African Amer.): 47 — ABNORMAL LOW
Glucose: 106 mg/dL — ABNORMAL HIGH (ref 65–99)
Osmolality: 290 (ref 275–301)
Potassium: 3.6 mmol/L (ref 3.5–5.1)

## 2012-09-25 LAB — CBC WITH DIFFERENTIAL/PLATELET
Basophil #: 0.1 10*3/uL (ref 0.0–0.1)
Eosinophil %: 0.7 %
HGB: 13.9 g/dL (ref 13.0–18.0)
Lymphocyte %: 32.9 %
MCH: 35.5 pg — ABNORMAL HIGH (ref 26.0–34.0)
MCV: 103 fL — ABNORMAL HIGH (ref 80–100)
Monocyte %: 9.2 %
Neutrophil #: 4 10*3/uL (ref 1.4–6.5)
Platelet: 155 10*3/uL (ref 150–440)
RDW: 16.2 % — ABNORMAL HIGH (ref 11.5–14.5)

## 2012-09-25 NOTE — Telephone Encounter (Signed)
Pt d/c from [facility] today 12/4 Will make TCM call tomm

## 2012-09-25 NOTE — Telephone Encounter (Signed)
tcm

## 2012-09-26 ENCOUNTER — Encounter: Payer: Self-pay | Admitting: Cardiovascular Disease

## 2012-09-27 ENCOUNTER — Encounter: Payer: Self-pay | Admitting: Cardiovascular Disease

## 2012-09-27 NOTE — Telephone Encounter (Signed)
TCM call I spoke with pt's wife (pt was still sleeping) She says pt has been feeling well since d/c Denies worsening sob or edema Confirms compliance with meds and confirms he has all meds he needs from d/c  Says pt send [place] an email last night re: "feeling dry" and letting us know he held his lasix yesterday Pt wanting some parameters for when to take lasix/when to hold I told wife I would discuss with Dr. Mariah Milling and will call pt back She says ok to email him response instead  see email documentation for further info

## 2012-09-28 ENCOUNTER — Encounter: Payer: Self-pay | Admitting: Cardiovascular Disease

## 2012-10-02 ENCOUNTER — Ambulatory Visit (INDEPENDENT_AMBULATORY_CARE_PROVIDER_SITE_OTHER): Payer: Medicare Other | Admitting: Cardiovascular Disease

## 2012-10-02 ENCOUNTER — Encounter: Payer: Self-pay | Admitting: Cardiovascular Disease

## 2012-10-02 VITALS — BP 104/60 | HR 87 | Ht 74.0 in | Wt 156.0 lb

## 2012-10-02 DIAGNOSIS — I5041 Acute combined systolic (congestive) and diastolic (congestive) heart failure: Secondary | ICD-10-CM

## 2012-10-02 DIAGNOSIS — E785 Hyperlipidemia, unspecified: Secondary | ICD-10-CM

## 2012-10-02 DIAGNOSIS — I5020 Unspecified systolic (congestive) heart failure: Secondary | ICD-10-CM

## 2012-10-02 DIAGNOSIS — I2510 Atherosclerotic heart disease of native coronary artery without angina pectoris: Secondary | ICD-10-CM | POA: Insufficient documentation

## 2012-10-02 DIAGNOSIS — I10 Essential (primary) hypertension: Secondary | ICD-10-CM

## 2012-10-02 DIAGNOSIS — R079 Chest pain, unspecified: Secondary | ICD-10-CM

## 2012-10-02 DIAGNOSIS — R0602 Shortness of breath: Secondary | ICD-10-CM

## 2012-10-02 DIAGNOSIS — I4891 Unspecified atrial fibrillation: Secondary | ICD-10-CM

## 2012-10-02 DIAGNOSIS — I6529 Occlusion and stenosis of unspecified carotid artery: Secondary | ICD-10-CM

## 2012-10-02 MED ORDER — DILTIAZEM HCL 30 MG PO TABS: 30.0000 mg | ORAL_TABLET | Freq: Three times a day (TID) | ORAL | Status: AC

## 2012-10-02 NOTE — Assessment & Plan Note (Signed)
Cholesterol is at goal on the current lipid regimen. No changes to the medications were made.  

## 2012-10-02 NOTE — Assessment & Plan Note (Addendum)
Heart rate is relatively well controlled I'm concerned about his low blood pressure. 100 systolic today with periodic weakness and malaise. We'll decrease his diltiazem from extended release 120 mg daily down to 30 mg 3 times a day. Continue warfarin, digoxin. Other option for rate control if needed is amiodarone.

## 2012-10-02 NOTE — Patient Instructions (Signed)
Please alternate lasix 20 mg with 10 mg every other day Goal weight 151 pounds  Please hold the diltiazem 120 daily Start diltiazem 30 mg three times a day  We will check blood work today  Please call us if you have new issues that need to be addressed before your next appt.  Your physician wants you to follow-up in: 3 weeks

## 2012-10-02 NOTE — Assessment & Plan Note (Signed)
By his report, cardiac catheterization 15 years ago or more with significant stenoses at that time. Attempt at stent placement was unsuccessful. This is likely the reason of his depressed ejection fraction.

## 2012-10-02 NOTE — Progress Notes (Signed)
Patient ID: Bruce Vaughan, male    DOB: 27-Feb-1921, 78 y.o.   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 He was diagnosed with acute systolic and diastolic CHF and poor heart rate control.  Digoxin was added for rate control, he was started on diuretics. Initial BNP was 14,000. Heart rate 100 to 120 on a regular basis, worse with exertion. With diuresis and rate control, his shortness of breath improved, lungs were clear and he was discharged.  Echocardiogram in the hospital showed ejection fraction 35-45%, mildly dilated left atrium, mild to moderate MR, mild to moderate TR, right ventricular systolic pressure 40-50 mmHg, moderate sized left pleural effusion he had baseline renal dysfunction in the hospital with creatinine 1.31 on arrival, troponin 0.4  He was recently readmitted to the hospital at [facility] at the beginning of December  2013 for similar symptoms. He was only taking Lasix periodically based on his weight. He would take Lasix if weight was greater than 152 pounds. He was given IV diuretic in the hospital with significant improvement in his symptoms.  Heart rate in the hospital was improved with rate consistently less than 100.  In followup today, he reports that his energy waxes and wanes. It is "two thirds what it should be".  He has been taking Lasix daily with potassium every other day. Weight has been stable at 150.4 pounds. He denies any significant shortness of breath but does have malaise. Blood pressure today is 100/50, and again on repeat was low. He reports that blood pressure at home is sometimes in the 120 range. Blood work from the  hospital showed BUN 31, creatinine 1.32 on December 4 at the time of discharge when Lasix was held for one day and dose was decreased (changed from IV to low-dose by mouth). Hemoglobin greater than 12 at the time of discharge  EKG today shows atrial fibrillation with rate 88 beats per minute, right bundle branch block, left anterior fascicular block,  PVCs.     Outpatient Encounter Prescriptions as of 10/02/2012  Medication Sig Dispense Refill  . digoxin (LANOXIN) 0.125 MG tablet Take 1 tablet (0.125 mg total) by mouth daily.  30 tablet  3  . diltiazem (CARDIZEM CD) 120 MG 24 hr capsule Take 1 capsule (120 mg total) by mouth daily.  90 capsule  3  . ferrous gluconate (FERGON) 324 MG tablet Take 324 mg by mouth daily with breakfast.      . furosemide (LASIX) 20 MG tablet Take 1 tablet (20 mg total) by mouth daily as needed.  90 tablet  3  . hydrochlorothiazide (HYDRODIURIL) 25 MG tablet Take 1 tablet (25 mg total) by mouth daily.  30 tablet  5  . Misc Natural Products (FIBER 7 PO) Take by mouth daily.       . Multiple Vitamin (MULTIVITAMIN) tablet Take 1 tablet by mouth daily.      . niacin (NIASPAN) 750 MG CR tablet Take 750 mg by mouth at bedtime.      . niacin (NIASPAN) 750 MG CR tablet Take 750 mg by mouth daily.      . Nutritional Supplements (CHOICE DM FIBER-BURST PO) Take 1.5 g by mouth daily.      Marland Kitchen  pantoprazole (PROTONIX) 40 MG tablet Take 40 mg by mouth daily.      . potassium chloride (MICRO-K) 10 MEQ CR capsule Take 1 capsule (10 mEq total) by mouth daily.  90 capsule  4  . simvastatin (ZOCOR) 40 MG tablet Take 1 tablet (40 mg total) by mouth every evening.  90 tablet  3  . warfarin (COUMADIN) 4 MG tablet Take 4 mg by mouth daily.         Review of Systems  Constitutional: Positive for fatigue.  HENT: Negative.   Eyes: Negative.   Respiratory: Negative.   Cardiovascular: Negative.   Gastrointestinal: Negative.   Musculoskeletal: Negative.   Skin: Negative.    Neurological: Positive for weakness.  Hematological: Negative.   Psychiatric/Behavioral: Negative.   All other systems reviewed and are negative.    BP 104/60  Ht 6\' 2"  (1.88 m)  Wt 156 lb (70.761 kg)  BMI 20.03 kg/m2  Physical Exam  Nursing note and vitals reviewed. Constitutional: He is oriented to person, place, and time.       Thin, appears weak,  pale  HENT:  Head: Normocephalic.  Nose: Nose normal.  Mouth/Throat: Oropharynx is clear and moist.  Eyes: Conjunctivae normal are normal. Pupils are equal, round, and reactive to light.  Neck: Normal range of motion. Neck supple. No JVD present.  Cardiovascular: Normal rate, normal heart sounds and intact distal pulses.  An irregularly irregular rhythm present. Exam reveals no gallop and no friction rub.   No murmur heard. Pulmonary/Chest: Effort normal and breath sounds normal. No respiratory distress. He has no wheezes. He has no rales. He exhibits no tenderness.  Abdominal: Soft. Bowel sounds are normal. He exhibits no distension. There is no tenderness.  Musculoskeletal: Normal range of motion. He exhibits no edema and no tenderness.  Lymphadenopathy:    He has no cervical adenopathy.  Neurological: He is alert and oriented to person, place, and time. Coordination normal.  Skin: Skin is warm and dry. No rash noted. No erythema.  Psychiatric: He has a normal mood and affect. His behavior is normal. Judgment and thought content normal.           Assessment and Plan

## 2012-10-02 NOTE — Assessment & Plan Note (Signed)
Severe carotid arterial disease. He does not want intervention.

## 2012-10-02 NOTE — Assessment & Plan Note (Signed)
Blood pressure is low. We have suggested he hold lisinopril, we'll decrease the diltiazem.

## 2012-10-02 NOTE — Assessment & Plan Note (Signed)
Ischemic cardiomyopathy, underlying atrial fibrillation. We have suggested he take Lasix 20 mg alternating with 10 mg with goal weight 150.5 up to 151 pounds. Potassium every other day.

## 2012-10-05 LAB — BASIC METABOLIC PANEL
Creatinine, Ser: 1.33 mg/dL — ABNORMAL HIGH (ref 0.76–1.27)
GFR calc Af Amer: 54 mL/min/{1.73_m2} — ABNORMAL LOW (ref >59)
GFR calc non Af Amer: 47 mL/min/{1.73_m2} — ABNORMAL LOW (ref >59)
Glucose: 130 mg/dL — ABNORMAL HIGH (ref 65–99)

## 2012-10-06 ENCOUNTER — Encounter: Payer: Self-pay | Admitting: Cardiovascular Disease

## 2012-10-07 ENCOUNTER — Encounter: Payer: Self-pay | Admitting: Cardiovascular Disease

## 2012-10-08 ENCOUNTER — Telehealth: Payer: Self-pay | Admitting: Cardiovascular Disease

## 2012-10-08 ENCOUNTER — Other Ambulatory Visit: Payer: Self-pay

## 2012-10-08 DIAGNOSIS — I10 Essential (primary) hypertension: Secondary | ICD-10-CM

## 2012-10-08 NOTE — Telephone Encounter (Signed)
Pt calling concerning his weight and his lasix. Pt weight has gone up a Bruce Vaughan and his is taking 20 mg. Pt is wheezing in the am. Also has questions about his labs.

## 2012-10-08 NOTE — Telephone Encounter (Signed)
I discussed this with Dr. Mariah Milling. He suggests pt take an extra lasix 20 mg today and tomorrow and call us with weights. He also suggests we mention to pt the possibility of a CHF nurse to help him. I will call pt to explain.

## 2012-10-08 NOTE — Telephone Encounter (Signed)
I informed pt of Dr. Windell Hummingbird suggestion.  He verb. Understanding.  He will take potassium with the extra lasix today and tomm. He says he has appt with Dr. Darrick Huntsman 12/20 and would like potassium checked at this time. I told him I would put order in for them to check at that time. Understanding verb. Pt declines offer for CHF nurse

## 2012-10-08 NOTE — Telephone Encounter (Signed)
Pt says his weight is staying above 151 pounds. He is weighing 153 pounds. He is also c/o wheezing in mornings and LE edema Says wheezing "gets better throughout the day" but still has edema c/o "no energy" and worsening SOB He asks if Dr. Mariah Milling can see him today. I told him I would discuss with him and call him back. Understanding verb.

## 2012-10-09 ENCOUNTER — Ambulatory Visit (INDEPENDENT_AMBULATORY_CARE_PROVIDER_SITE_OTHER): Payer: Medicare Other | Admitting: Cardiovascular Disease

## 2012-10-09 ENCOUNTER — Encounter: Payer: Self-pay | Admitting: Internal Medicine

## 2012-10-09 DIAGNOSIS — Z7901 Long term (current) use of anticoagulants: Secondary | ICD-10-CM

## 2012-10-09 DIAGNOSIS — I4891 Unspecified atrial fibrillation: Secondary | ICD-10-CM

## 2012-10-11 ENCOUNTER — Ambulatory Visit (INDEPENDENT_AMBULATORY_CARE_PROVIDER_SITE_OTHER): Payer: Medicare Other | Admitting: Internal Medicine

## 2012-10-11 ENCOUNTER — Encounter: Payer: Self-pay | Admitting: Internal Medicine

## 2012-10-11 VITALS — BP 110/64 | HR 94 | Temp 97.4°F | Resp 12 | Ht 74.0 in | Wt 149.8 lb

## 2012-10-11 DIAGNOSIS — Z1331 Encounter for screening for depression: Secondary | ICD-10-CM

## 2012-10-11 DIAGNOSIS — I509 Heart failure, unspecified: Secondary | ICD-10-CM

## 2012-10-11 DIAGNOSIS — I5041 Acute combined systolic (congestive) and diastolic (congestive) heart failure: Secondary | ICD-10-CM

## 2012-10-11 DIAGNOSIS — I4891 Unspecified atrial fibrillation: Secondary | ICD-10-CM

## 2012-10-11 MED ORDER — FUROSEMIDE 20 MG PO TABS: 20.0000 mg | ORAL_TABLET | Freq: Every day | ORAL | Status: DC | PRN

## 2012-10-11 NOTE — Patient Instructions (Addendum)
If your weight increases by  Over 1.5 lbs  overnight , take 40 mg of lasix.   Anytime you take 40 mg of lasix, take a potassium supplement  Your ejection fraction is 35 to 45%, which is low ,  And is complicated by your irregular heart beat

## 2012-10-11 NOTE — Progress Notes (Signed)
Patient ID: Bruce Vaughan, male   DOB: [birth year], 78 y.o.   MRN: [id number]  Patient Active Problem List  Diagnosis  . Hyperlipidemia  . Hypertension  . Atrial fibrillation  . Carotid stenosis, non-symptomatic  . Acute combined systolic and diastolic CHF, NYHA class 1  . Long term (current) use of anticoagulants  . CAD (coronary artery disease)    Subjective:  CC:   Chief Complaint  Patient presents with  . Follow-up    HPI:   Bruce Vaughan a 78 y.o. male who presents for follow up on chronic problems including congestive heart failure secondary to atrial fibrillation and systolic dysfunction, EF 35%. He was treated by Dossie Arbour with digoxin and lasix were started.  He has been adjusting his lasix dose based on daily weight measurements.  He has been told to alternate between 20 and 10 mg daily but has noted his weight creping up gradually over his baseline of 151 lbs. He finds that his level of energy varies a lot.  His diltiazem was changed to short acting by Dr. Mariah Milling.  He has been waking up with a dry mouth but sleeps with his mouth open and does not snore.     Past Medical History  Diagnosis Date  . Hyperlipidemia   . Hypertension   . Atrial fibrillation   . prostate Cancer   . Carotid stenosis, non-symptomatic   . Coronary artery disease     s/p attemp to place cardiac stent, however was not successful.  . Chronic anemia   . GERD (gastroesophageal reflux disease)   . CHF (congestive heart failure)     Past Surgical History  Procedure Date  . Thyroidectomy     subtotal ,  nonmalignant tumor  . Tonsillectomy and adenoidectomy   . Appendectomy 1930  . Prostate surgery 1994    radical, annual PSAs low  . Skin cancer excision   . Partial thyroid res          The following portions of the patient's history were reviewed and updated as appropriate: Allergies, current medications, and problem list.    Review of Systems:   12 Pt  review of systems was  negative except those addressed in the HPI,     History   Social History  . Marital Status: Married    Spouse Name: N/A    Number of Children: N/A  . Years of Education: N/A   Occupational History  . Not on file.   Social History Main Topics  . Smoking status: Former Smoker    Types: Cigarettes    Quit date: 04/10/1982  . Smokeless tobacco: Never Used  . Alcohol Use: Yes  . Drug Use: No  . Sexually Active: Not on file   Other Topics Concern  . Not on file   Social History Narrative  . No narrative on file    Objective:  BP 110/64  Pulse 94  Temp 97.4 F (36.3 C) (Oral)  Resp 12  Ht 6\' 2"  (1.88 m)  Wt 149 lb 12 oz (67.926 kg)  BMI 19.23 kg/m2  SpO2 93%  General appearance: alert, cooperative and appears stated age Ears: normal TM's and external ear canals both ears Throat: lips, mucosa, and tongue normal; teeth and gums normal Neck: no adenopathy, no carotid bruit, supple, symmetrical, trachea midline and thyroid not enlarged, symmetric, no tenderness/mass/nodules Back: symmetric, no curvature. ROM normal. No CVA tenderness. Lungs: clear to auscultation bilaterally Heart: regular rate and rhythm, S1, S2 normal, no  murmur, click, rub or gallop Abdomen: soft, non-tender; bowel sounds normal; no masses,  no organomegaly Pulses: 2+ and symmetric Skin: Skin color, texture, turgor normal. No rashes or lesions Lymph nodes: Cervical, supraclavicular, and axillary nodes normal.  Assessment and Plan:  Acute combined systolic and diastolic CHF, NYHA class 1 Spent 30 minutes discussing the nature of his heart failure including the effect of his arrhythmia on his decreaed EF and the overall impact on his energy level.  Discussed dietary sources of sodium to avoid.  Advised after  Today's visit to increase lasix to 40 mg daily for progressive weight gain until his weight is back down to 151 lbs, then resume 20 mg daily and have a BMET checked on Friday.     Atrial  fibrillation currently managed with cardizem and coumadin.    Updated Medication List Outpatient Encounter Prescriptions as of 10/11/2012  Medication Sig Dispense Refill  . digoxin (LANOXIN) 0.125 MG tablet Take 1 tablet (0.125 mg total) by mouth daily.  30 tablet  3  . diltiazem (CARDIZEM) 30 MG tablet Take 1 tablet (30 mg total) by mouth 3 (three) times daily.  90 tablet  6  . ferrous gluconate (FERGON) 324 MG tablet Take 324 mg by mouth 2 (two) times daily.       . furosemide (LASIX) 20 MG tablet Take 1 tablet (20 mg total) by mouth daily as needed.  90 tablet  3  . Misc Natural Products (FIBER 7 PO) Take by mouth 2 (two) times daily.       . Multiple Vitamin (MULTIVITAMIN) tablet Take 1 tablet by mouth daily.      . niacin (NIASPAN) 750 MG CR tablet Take 750 mg by mouth at bedtime.      . pantoprazole (PROTONIX) 40 MG tablet Take 40 mg by mouth daily.      . potassium chloride (MICRO-K) 10 MEQ CR capsule Take 10 mEq by mouth daily as needed.      . simvastatin (ZOCOR) 40 MG tablet Take 1 tablet (40 mg total) by mouth every evening.  90 tablet  3  . warfarin (COUMADIN) 4 MG tablet Take 4 mg by mouth as directed.       . [DISCONTINUED] furosemide (LASIX) 20 MG tablet Take 1 tablet (20 mg total) by mouth daily as needed.  90 tablet  3     No orders of the defined types were placed in this encounter.    Return in about 3 months (around 01/09/2013).

## 2012-10-13 ENCOUNTER — Encounter: Payer: Self-pay | Admitting: Internal Medicine

## 2012-10-13 ENCOUNTER — Encounter: Payer: Self-pay | Admitting: Cardiovascular Disease

## 2012-10-14 ENCOUNTER — Encounter: Payer: Self-pay | Admitting: Internal Medicine

## 2012-10-14 NOTE — Assessment & Plan Note (Addendum)
Advised after  Today's visit to increase lasix to 40 mg daily for progressive weight gain until his weight is back down to 151 lbs, then resume 20 mg daily and have a BMET checked on Friday.

## 2012-10-14 NOTE — Assessment & Plan Note (Signed)
currently managed with cardizem and coumadin.

## 2012-10-17 ENCOUNTER — Telehealth: Payer: Self-pay | Admitting: Internal Medicine

## 2012-10-17 ENCOUNTER — Encounter: Payer: Self-pay | Admitting: Internal Medicine

## 2012-10-17 NOTE — Telephone Encounter (Signed)
Bruce Vaughan, had to increase his lasix to 40 mg daily for a few days bc of wt gain when using 20 mg daily.Marland Kitchen  His GFR is starting to decline on the higher dose of daily lasix,   I am repeating BMET in one week  (Cr was 1.3, now 1.55)

## 2012-10-18 ENCOUNTER — Encounter: Payer: Self-pay | Admitting: Internal Medicine

## 2012-10-18 DIAGNOSIS — I509 Heart failure, unspecified: Secondary | ICD-10-CM

## 2012-10-18 NOTE — Telephone Encounter (Signed)
Chronic atrial fibrillation, we would never get him out. Not to mention he has refused every intervention offered. Other option for diuretics include torsemide which may be more effective, perhaps better absorbed. Or periodically using metolazone prn 2.5 up to 5 mg 30 minutes before Lasix

## 2012-10-18 NOTE — Telephone Encounter (Signed)
His admissions to the hospital for weakness and malaise have been very vague. Really uncertain if they have been secondary to diastolic CHF. Minimal doses of Lasix in the hospital with minimal urine output and weight loss, some tender loving care and he seems to improve. He seems to be getting weaker quite rapidly. Uncertain if he is interpreting his natural progression/weakness as being something else such as fluid.

## 2012-10-18 NOTE — Telephone Encounter (Signed)
The wt gain appears to be real , he is weighing daily and has gained wt on 10 and 20 mg daily doses.  I guess I was wondering if his GFR continues to decline with doses necessary to maintain baseline wt, would he be a candidate for cardioversion?

## 2012-10-19 ENCOUNTER — Encounter: Payer: Self-pay | Admitting: Internal Medicine

## 2012-10-19 MED ORDER — TORSEMIDE 10 MG PO TABS: 10.0000 mg | ORAL_TABLET | Freq: Two times a day (BID) | ORAL | Status: AC | PRN

## 2012-10-19 NOTE — Telephone Encounter (Signed)
Ok, thanks for the background and suggestions!

## 2012-10-21 ENCOUNTER — Telehealth: Payer: Self-pay | Admitting: Cardiovascular Disease

## 2012-10-21 ENCOUNTER — Other Ambulatory Visit: Payer: Self-pay

## 2012-10-21 ENCOUNTER — Telehealth: Payer: Self-pay | Admitting: Internal Medicine

## 2012-10-21 ENCOUNTER — Encounter: Payer: Self-pay | Admitting: Internal Medicine

## 2012-10-21 DIAGNOSIS — I4891 Unspecified atrial fibrillation: Secondary | ICD-10-CM

## 2012-10-21 NOTE — Telephone Encounter (Signed)
I discussed issue with Dr. Kirke Corin.  He suggests having pt increase torsemide to 20 mg daily x a few days and check labs Thursday 10/24/12 as planned.   I called pt and dtr to explain this. They verb understanding although pt wishes to have labs checked tomm. I explained diuretic may not have had a chance to take effect enough to check these levels.  He says he wants this checked "anyway and if necessary I will come back in on Thursday".  We will check labs for pt at his suggestion tomm.  He will let us know at that time how he is feeling from a CHF standpoint.

## 2012-10-21 NOTE — Telephone Encounter (Signed)
Patient is needing a hospital bed put in there apartment. Please fax a note to [facility] for a hospital bed .[facility] Fax # [phone].

## 2012-10-21 NOTE — Telephone Encounter (Signed)
Pt daughter calling states that pt has has an increase in weight and not feeling well. Dr Darrick Huntsman did change his medication at the request of Dr Mariah Milling. daughter  Wants to speak to nurse. Daughter cell [phone]

## 2012-10-21 NOTE — Telephone Encounter (Signed)
Pt notified that it is ok to take the low dose torsemide per Dr. Darrick Huntsman and was advised to check his PT/INR and Digoxin levels on Thursday.

## 2012-10-21 NOTE — Telephone Encounter (Signed)
Staff message sent to Dr. Darrick Huntsman

## 2012-10-21 NOTE — Telephone Encounter (Signed)
[name] says pt's diuretic was changed from furosemide to torsemide 20 mg.  Today was his first dose of torsemide 20 mg.  Dtr says pt's weight is 155 pounds (goal weight=151 pounds). Says pt is sob, has LE edema and is fatigued.  Also c/o decreased appetite.  Also says INR=4.6.  I advised to have pt take extra torsemide 20 mg today and see if this helps weight/symptoms.  Dtr is hesitant, afraid to give him a total of "3 doses" of torsemide. I explained, per Dr. Melina Schools last email to pt, she wrote torsemide for BID but only wanted him to take this once daily. Says she gave him extra pills in case he needed this.  Dtr says pt is going to follow the instructions on bottle.  I offered to work pt in with Dr. Kirke Corin tomm. Dr. Mariah Milling is out of town.   She asks if they should be communicating with Dr. Darrick Huntsman instead. I told dtr I would be glad to try to get in touch with Dr. Darrick Huntsman for pt and see if she has any other suggestions. I will then call dtr back. Understanding verb.

## 2012-10-21 NOTE — Telephone Encounter (Signed)
I spoke with Shanda Bumps with Dr. Melina Schools office. She explains she spoke with pt today as well re: ok to go ahead and start torsemide today and to have labs checked (PT/INR and dig levels) Thursday. She says ok for [place] to manage. If we see pt this week, we will go ahead and draw labs. I will call pt/dtr to inform.

## 2012-10-21 NOTE — Telephone Encounter (Signed)
i cannot get  A DME order of that nature authorized by Medicare without an office visit  To document need.

## 2012-10-22 ENCOUNTER — Other Ambulatory Visit: Payer: Medicare Other

## 2012-10-22 ENCOUNTER — Telehealth: Payer: Self-pay | Admitting: Internal Medicine

## 2012-10-22 ENCOUNTER — Telehealth: Payer: Self-pay

## 2012-10-22 ENCOUNTER — Inpatient Hospital Stay: Payer: Self-pay | Admitting: Family Medicine

## 2012-10-22 DIAGNOSIS — I4891 Unspecified atrial fibrillation: Secondary | ICD-10-CM

## 2012-10-22 LAB — CBC
HCT: 44.1 % (ref 40.0–52.0)
HGB: 15.2 g/dL (ref 13.0–18.0)
MCHC: 34.4 g/dL (ref 32.0–36.0)
RBC: 4.23 10*6/uL — ABNORMAL LOW (ref 4.40–5.90)
RDW: 16.8 % — ABNORMAL HIGH (ref 11.5–14.5)
WBC: 10.7 10*3/uL — ABNORMAL HIGH (ref 3.8–10.6)

## 2012-10-22 LAB — COMPREHENSIVE METABOLIC PANEL
Albumin: 3.4 g/dL (ref 3.4–5.0)
Anion Gap: 12 (ref 7–16)
Bilirubin,Total: 2.4 mg/dL — ABNORMAL HIGH (ref 0.2–1.0)
Calcium, Total: 9.2 mg/dL (ref 8.5–10.1)
Chloride: 99 mmol/L (ref 98–107)
Co2: 26 mmol/L (ref 21–32)
Creatinine: 2.14 mg/dL — ABNORMAL HIGH (ref 0.60–1.30)
Osmolality: 291 (ref 275–301)
SGOT(AST): 75 U/L — ABNORMAL HIGH (ref 15–37)
SGPT (ALT): 59 U/L (ref 12–78)
Total Protein: 6.9 g/dL (ref 6.4–8.2)

## 2012-10-22 LAB — CK TOTAL AND CKMB (NOT AT ARMC): CK-MB: 4 ng/mL — ABNORMAL HIGH (ref 0.5–3.6)

## 2012-10-22 LAB — PROTIME-INR
INR: 3.3
Prothrombin Time: 33.5 secs — ABNORMAL HIGH (ref 11.5–14.7)

## 2012-10-22 NOTE — Telephone Encounter (Signed)
Order faxed to Sherri's attention She confirms she received this and will have him transferred out to [facility]

## 2012-10-22 NOTE — Telephone Encounter (Signed)
I spoke with Dr. Darrick Huntsman. She agrees pt should go to [facility] for possible admission. I called pt's dtr and advised her of this.  She was able to let me speak with Roanna Raider (staff at [facility]) who says we need to fax order to send pt to [facility] and they will have him transported. I told her i would do this now.

## 2012-10-22 NOTE — Telephone Encounter (Signed)
I received t/c from pt's dtr, Bruce Vaughan, who says pt is worse this am. Says he spent night is the "nursing home" side of [place] last night. Says he has severe DOE, edema and weight is up another 1.5 pounds.  INR yesterday was 4.6. He held warfarin yesterday and this am. He is due to have digoxin level checked today but dtr says they areunable to bring him in d/t symptoms.  She asks if they can check this at [facility].   Dtr also tells me BMP was checked yesterday at Dr. Melina Schools request. She gave me verbal results:BUN=47, Creat=2.05, K=4.1, glucose=199, Na=136, chloride=94, CO2=26, calcium=9.8 Says pt held demadex this am b/c he is "afraid of interaction between this and digoxin.  She confirms compliance with digoxin and diltiazem this am.  BP=113/76, HR=62 irregular O2 sats this am on RA=88%, was placed on 2L oxygen via nasal canula, says nurse aucultated lungs this am and were "clear" Having trouble swallowing and is "getting harder to swallow his pills" Because of his worsening symptoms, increased creatinine and low O2 sats, i told dtr I would call Dr. Darrick Huntsman to see if she feels pt should be admitted. She verb. Understanding and feels this may be a good idea

## 2012-10-22 NOTE — Telephone Encounter (Signed)
Daughter called in states her dads health is declining fairly quickly, she states he is over 5.5 lbs over his weight goal that includes a one pound weight gain since yesterday.  Basic metabolic was drawn yesterday she states his renal function is out of wack a little, he isn't eating anything other than a small amount of juice and fruit, she states it is very difficult for him to swallow his pills, 113/76 was his vitals today, she states that he has stopped taking his blood pressure medication O2 88% they just started him on supplementary oxygen, Nurse Boyd Kerbs just listened to his lungs and she states they sounded clear, his legs are swollen. He doesn't want to take the new torfemide because it conflicts against his medication for a fib, he did take this twice yesterday, he does have an appointment today at the cardiologist at 2:00, at first she didn't think he would be able to take him since he is so windy, however since he stays at [facility] they provide him transportation. She states he isn't doing well and the past two days he has been in the nursing home part of [address] of [place]. His PT/INR was 4.6 yesterday he hasn't taken warfin for the past 2 days. She wanted to know if they should take him to the hospital since he isn't getting any better or if he should be set up with Hospice.  She is going to call the cardiologist office to see if he has to come in today for just blood work or if they can just draw his labs at the facility. I tried to transfer call to the triage line however I accidentally hung up on patient and tried calling her back so that I could transfer her and I left a voice mail. Daughter called back and I was successful at sending her to triage line.

## 2012-10-22 NOTE — Telephone Encounter (Signed)
Attempt x 2 to reach daughter. Left message.

## 2012-10-23 DIAGNOSIS — I059 Rheumatic mitral valve disease, unspecified: Secondary | ICD-10-CM

## 2012-10-23 LAB — CBC WITH DIFFERENTIAL/PLATELET
Basophil #: 0.1 10*3/uL (ref 0.0–0.1)
Basophil %: 0.5 %
Eosinophil #: 0 10*3/uL (ref 0.0–0.7)
Eosinophil %: 0.2 %
HGB: 14.3 g/dL (ref 13.0–18.0)
Lymphocyte #: 0.9 10*3/uL — ABNORMAL LOW (ref 1.0–3.6)
Lymphocyte %: 9.5 %
MCHC: 34.4 g/dL (ref 32.0–36.0)
MCV: 104 fL — ABNORMAL HIGH (ref 80–100)
Monocyte #: 0.7 x10 3/mm (ref 0.2–1.0)
Monocyte %: 7.4 %
Neutrophil #: 8.1 10*3/uL — ABNORMAL HIGH (ref 1.4–6.5)
Neutrophil %: 82.4 %
Platelet: 147 10*3/uL — ABNORMAL LOW (ref 150–440)
RBC: 4 10*6/uL — ABNORMAL LOW (ref 4.40–5.90)
RDW: 17 % — ABNORMAL HIGH (ref 11.5–14.5)
WBC: 9.8 10*3/uL (ref 3.8–10.6)

## 2012-10-23 LAB — BASIC METABOLIC PANEL
Calcium, Total: 9.1 mg/dL (ref 8.5–10.1)
Chloride: 100 mmol/L (ref 98–107)
Creatinine: 2.45 mg/dL — ABNORMAL HIGH (ref 0.60–1.30)
EGFR (Non-African Amer.): 22 — ABNORMAL LOW
Glucose: 140 mg/dL — ABNORMAL HIGH (ref 65–99)
Osmolality: 293 (ref 275–301)
Potassium: 4.3 mmol/L (ref 3.5–5.1)
Sodium: 138 mmol/L (ref 136–145)

## 2012-10-23 LAB — URINALYSIS, COMPLETE
Glucose,UR: NEGATIVE mg/dL (ref 0–75)
Hyaline Cast: 21
Ketone: NEGATIVE
Leukocyte Esterase: NEGATIVE
Nitrite: NEGATIVE
Ph: 5 (ref 4.5–8.0)
Specific Gravity: 1.02 (ref 1.003–1.030)
Squamous Epithelial: 1
WBC UR: 1 /HPF (ref 0–5)

## 2012-10-23 LAB — MAGNESIUM: Magnesium: 2.6 mg/dL — ABNORMAL HIGH

## 2012-10-23 LAB — CK TOTAL AND CKMB (NOT AT ARMC)
CK, Total: 92 U/L (ref 35–232)
CK-MB: 4 ng/mL — ABNORMAL HIGH (ref 0.5–3.6)

## 2012-10-23 LAB — PROTIME-INR: Prothrombin Time: 36.8 secs — ABNORMAL HIGH (ref 11.5–14.7)

## 2012-10-23 LAB — PROTEIN / CREATININE RATIO, URINE: Protein, Random Urine: 51 mg/dL — ABNORMAL HIGH (ref 0–12)

## 2012-10-24 ENCOUNTER — Ambulatory Visit: Payer: Self-pay | Admitting: Cardiovascular Disease

## 2012-10-24 DIAGNOSIS — Z7901 Long term (current) use of anticoagulants: Secondary | ICD-10-CM

## 2012-10-24 DIAGNOSIS — I4891 Unspecified atrial fibrillation: Secondary | ICD-10-CM

## 2012-10-24 LAB — RENAL FUNCTION PANEL
Albumin: 3.3 g/dL — ABNORMAL LOW (ref 3.4–5.0)
BUN: 58 mg/dL — ABNORMAL HIGH (ref 7–18)
Calcium, Total: 9.1 mg/dL (ref 8.5–10.1)
Co2: 26 mmol/L (ref 21–32)
Creatinine: 2.26 mg/dL — ABNORMAL HIGH (ref 0.60–1.30)
EGFR (African American): 29 — ABNORMAL LOW
EGFR (Non-African Amer.): 25 — ABNORMAL LOW
Glucose: 129 mg/dL — ABNORMAL HIGH (ref 65–99)
Osmolality: 292 (ref 275–301)
Phosphorus: 4.2 mg/dL (ref 2.5–4.9)
Potassium: 4.2 mmol/L (ref 3.5–5.1)
Sodium: 137 mmol/L (ref 136–145)

## 2012-10-24 LAB — URINE CULTURE

## 2012-10-24 LAB — PROTIME-INR
INR: 3.8
Prothrombin Time: 37.5 secs — ABNORMAL HIGH (ref 11.5–14.7)

## 2012-10-25 ENCOUNTER — Encounter: Payer: Self-pay | Admitting: Internal Medicine

## 2012-10-25 LAB — PROTEIN ELECTROPHORESIS(ARMC)

## 2012-10-25 LAB — UR PROT ELECTROPHORESIS, URINE RANDOM

## 2012-10-25 NOTE — Telephone Encounter (Signed)
Left message on patient home # vm of need to make appt. With Dr. Darrick Huntsman for getting a hospital bed.

## 2012-10-28 ENCOUNTER — Ambulatory Visit: Payer: Medicare Other | Admitting: Cardiovascular Disease

## 2012-10-30 ENCOUNTER — Ambulatory Visit: Payer: Medicare Other | Admitting: Cardiovascular Disease

## 2012-10-30 NOTE — Progress Notes (Signed)
Patient ID: Bruce Vaughan, male   DOB: 1921/08/28, 79 y.o.   MRN: [id number] I am sad to report that Mr. Ferrin died at [facility]. He arrested in the CT scanner. He had cath showing severe LAD and RCA disease.  Repeat echo confirming significant aortica valve stenosis. Resuscitation was unsuccessful.

## 2012-10-31 ENCOUNTER — Encounter: Payer: Self-pay | Admitting: Internal Medicine

## 2012-11-23 DEATH — deceased

## 2013-07-14 IMAGING — CT CT CHEST W/O CM
1 series · 16 of 32 positions shown, 20 images · non-contrast
Comparison: none

REASON FOR EXAM: consolidation on plain film
COMMENTS:

PROCEDURE:     CT  - CT CHEST WITHOUT CONTRAST  - September 23, 2012  [DATE]
RESULT:     History infiltrate.
Comparison Study: Chest x-ray

[Series 2: soft tissue · axial · 0.71mm/px · z∈[-422,-71]mm · 16 of 131 slices shown, 20 images]
[im 9/131  soft-tissue]
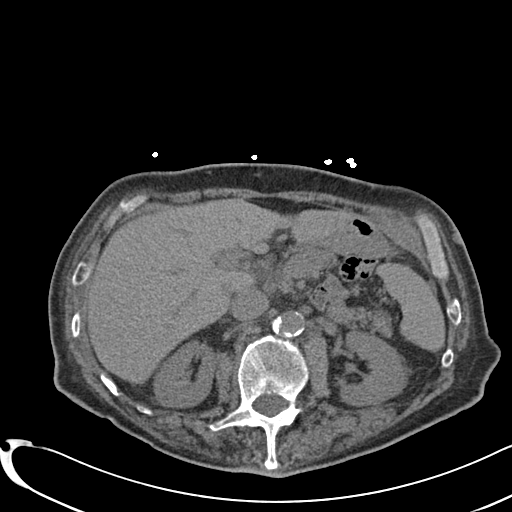
[im 9/131  bone]
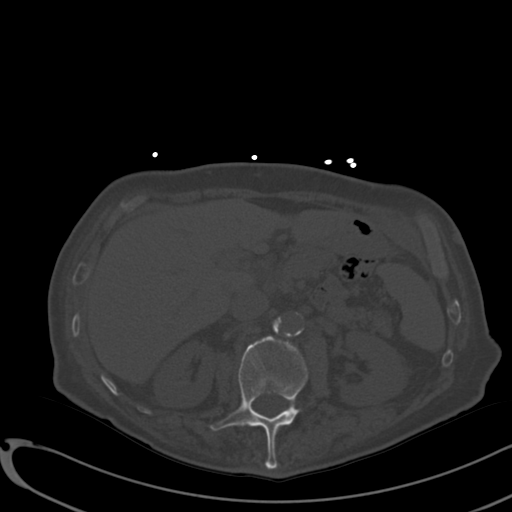
[im 17/131  soft-tissue]
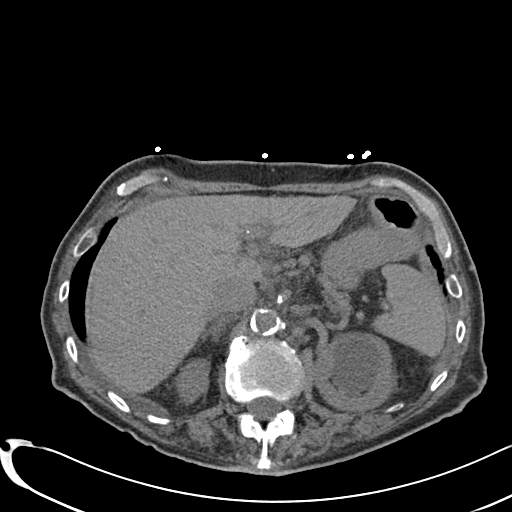
[im 26/131  soft-tissue]
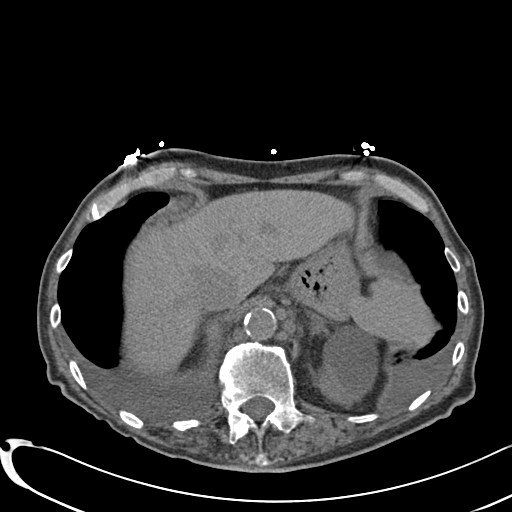
[im 34/131  soft-tissue]
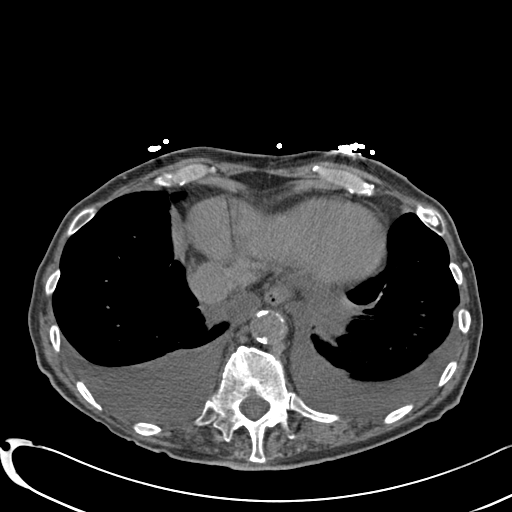
[im 42/131  soft-tissue]
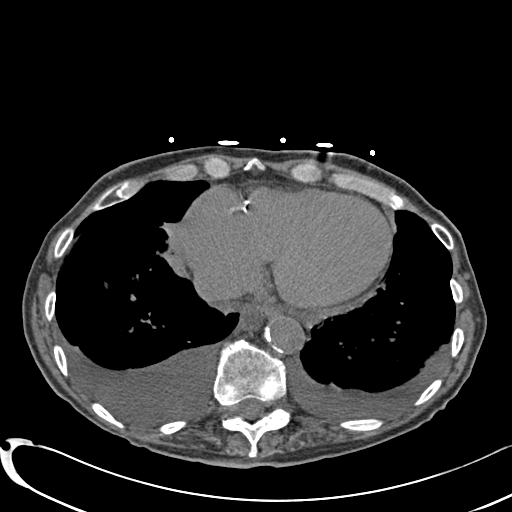
[im 51/131  soft-tissue]
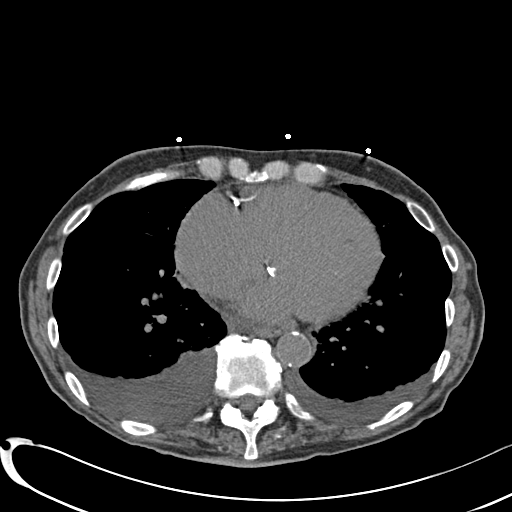
[im 59/131  soft-tissue]
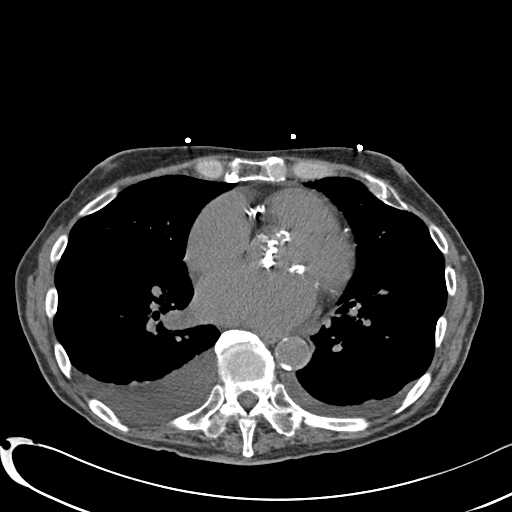
[im 72/131  soft-tissue]
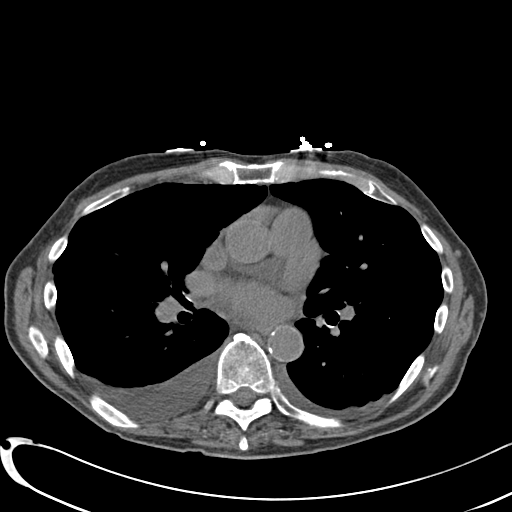
[im 80/131  soft-tissue]
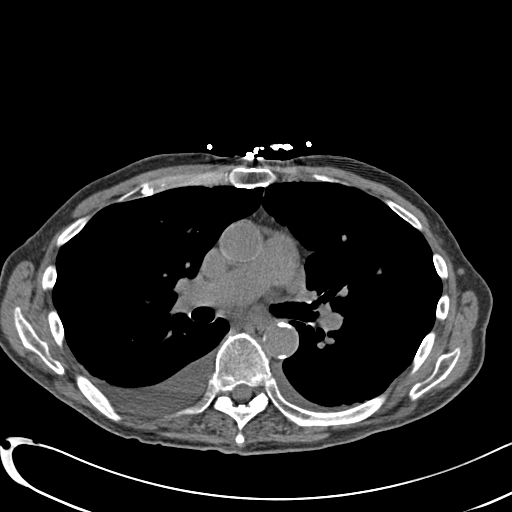
[im 80/131  bone]
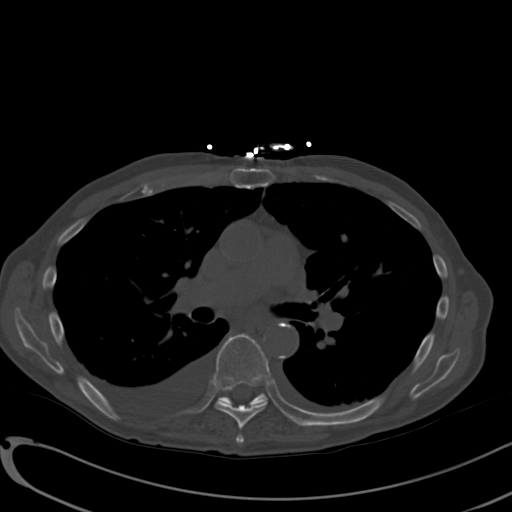
[im 89/131  soft-tissue]
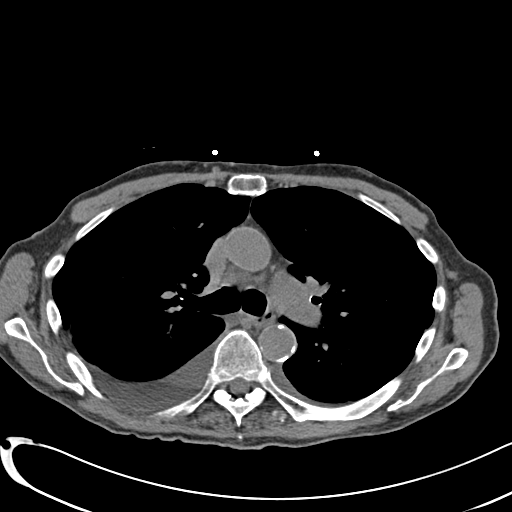
[im 97/131  soft-tissue]
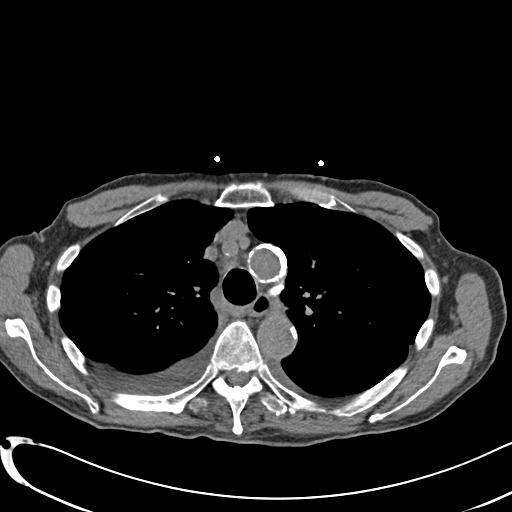
[im 105/131  soft-tissue]
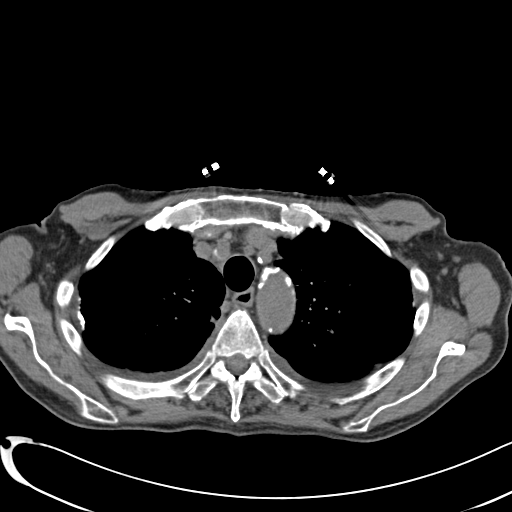
[im 114/131  soft-tissue]
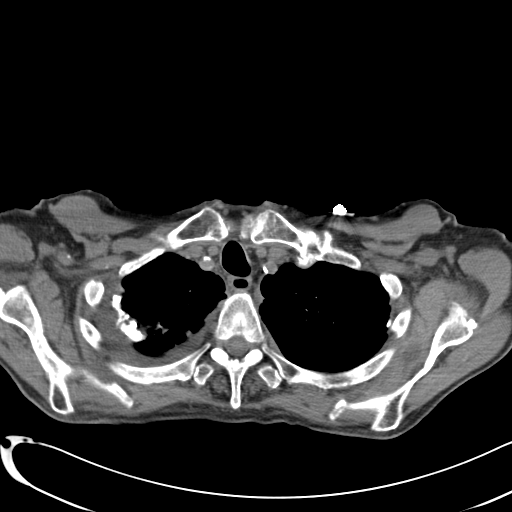
[im 114/131  lung]
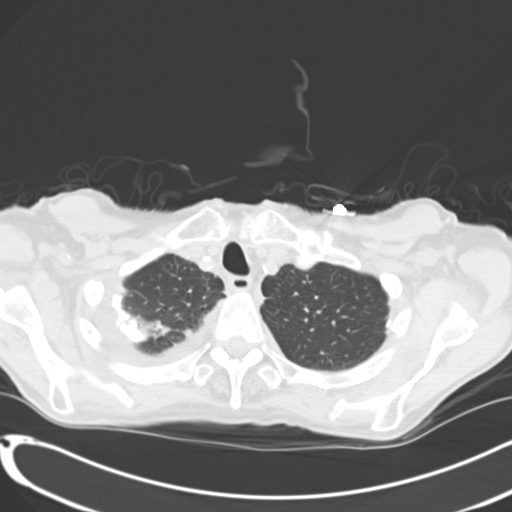
[im 118/131  lung]
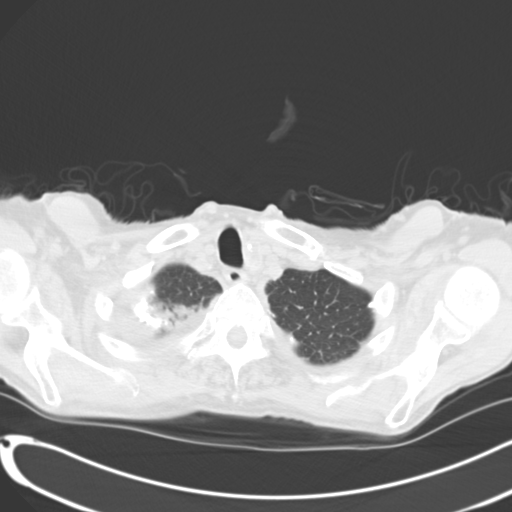
[im 122/131  soft-tissue]
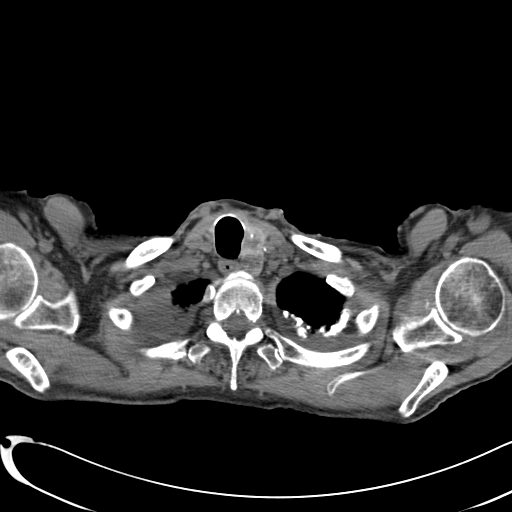
[im 122/131  lung]
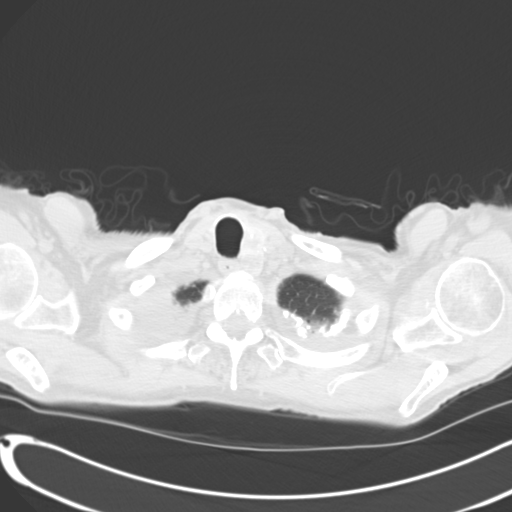
[im 126/131  lung]
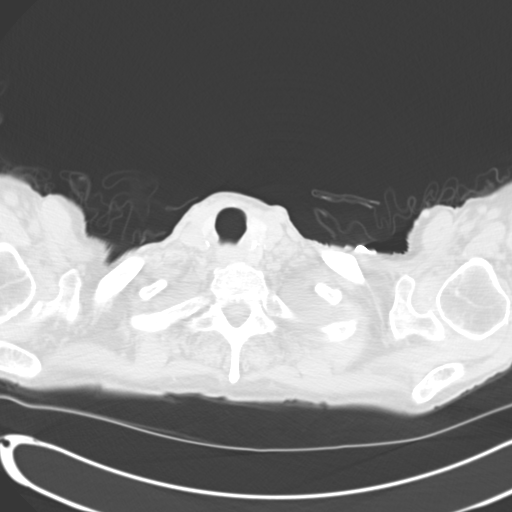

[16 of 32 positions shown; findings below may reference images not displayed]

FINDINGS: Standard nonenhanced CT obtained. Focal small mediastinal lymph
nodes noted. Coronary artery disease. Cardiomegaly. Bilateral pleural
effusions. Bilateral interstitial and alveolar changes in the lung bases
consistent with mild edema. Pneumonia could also present this fashion.
Adrenals are normal. Bilateral renal cysts. Large airways are patent. Apical
pleural thickening with calcification noted consistent with scarring. Prior
asbestos exposure cannot be excluded.
IMPRESSION: Congestive heart failure with pulmonary edema and bilateral
pleural effusions. Superimposed pneumonia cannot be excluded.

## 2013-07-14 IMAGING — CR DG CHEST 2V
1 series · 3 of 3 positions shown · non-contrast
Comparison: none

REASON FOR EXAM: weakness; mild hypoxia
COMMENTS:

[Series 1: x chest ap · 0.14mm/px · 3 of 3 slices shown]
[im 1/3]
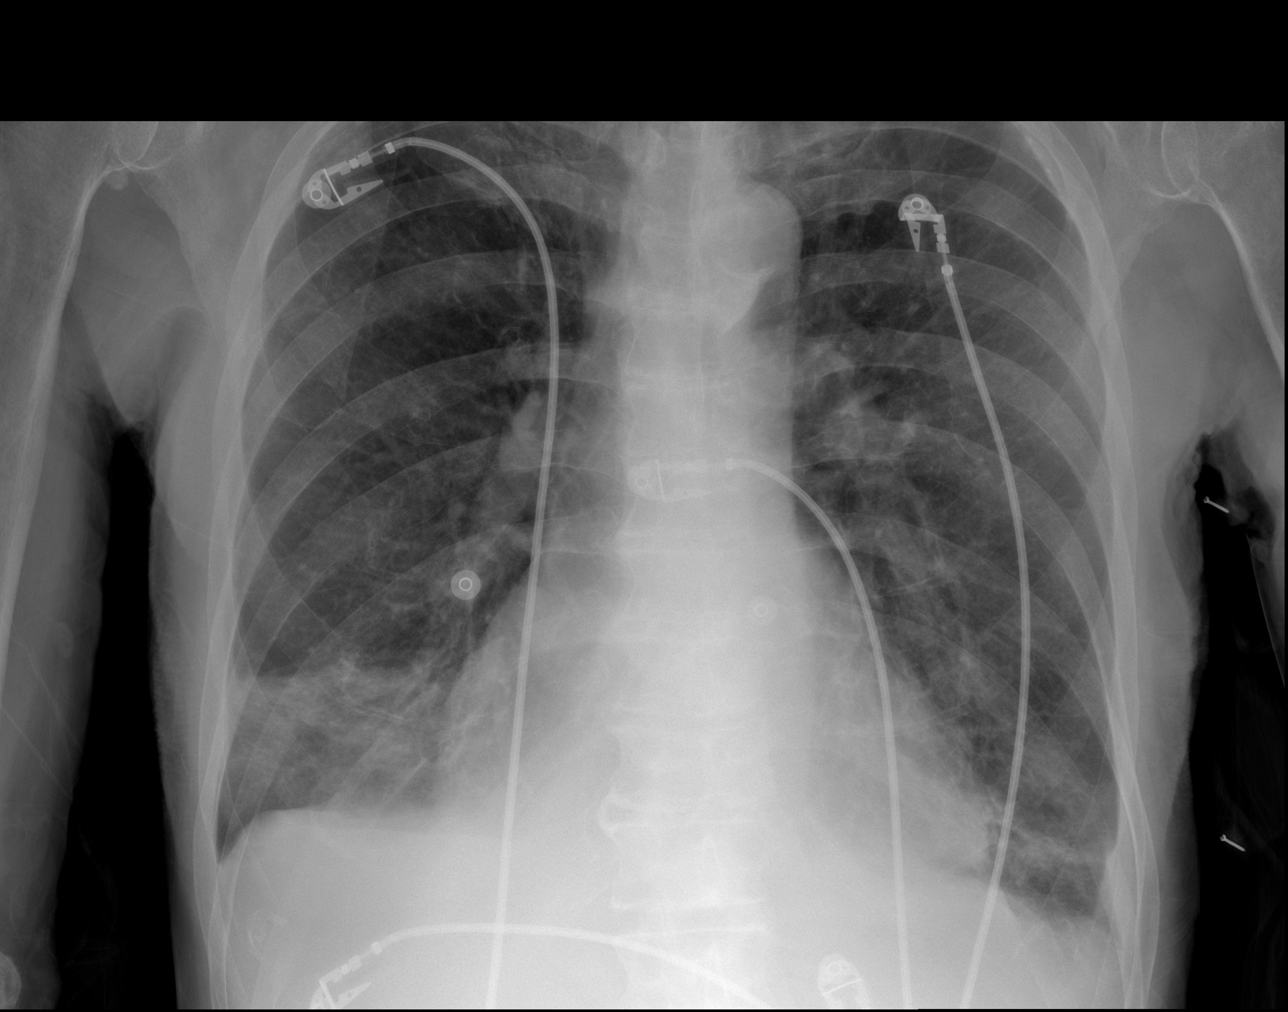
[im 2/3]
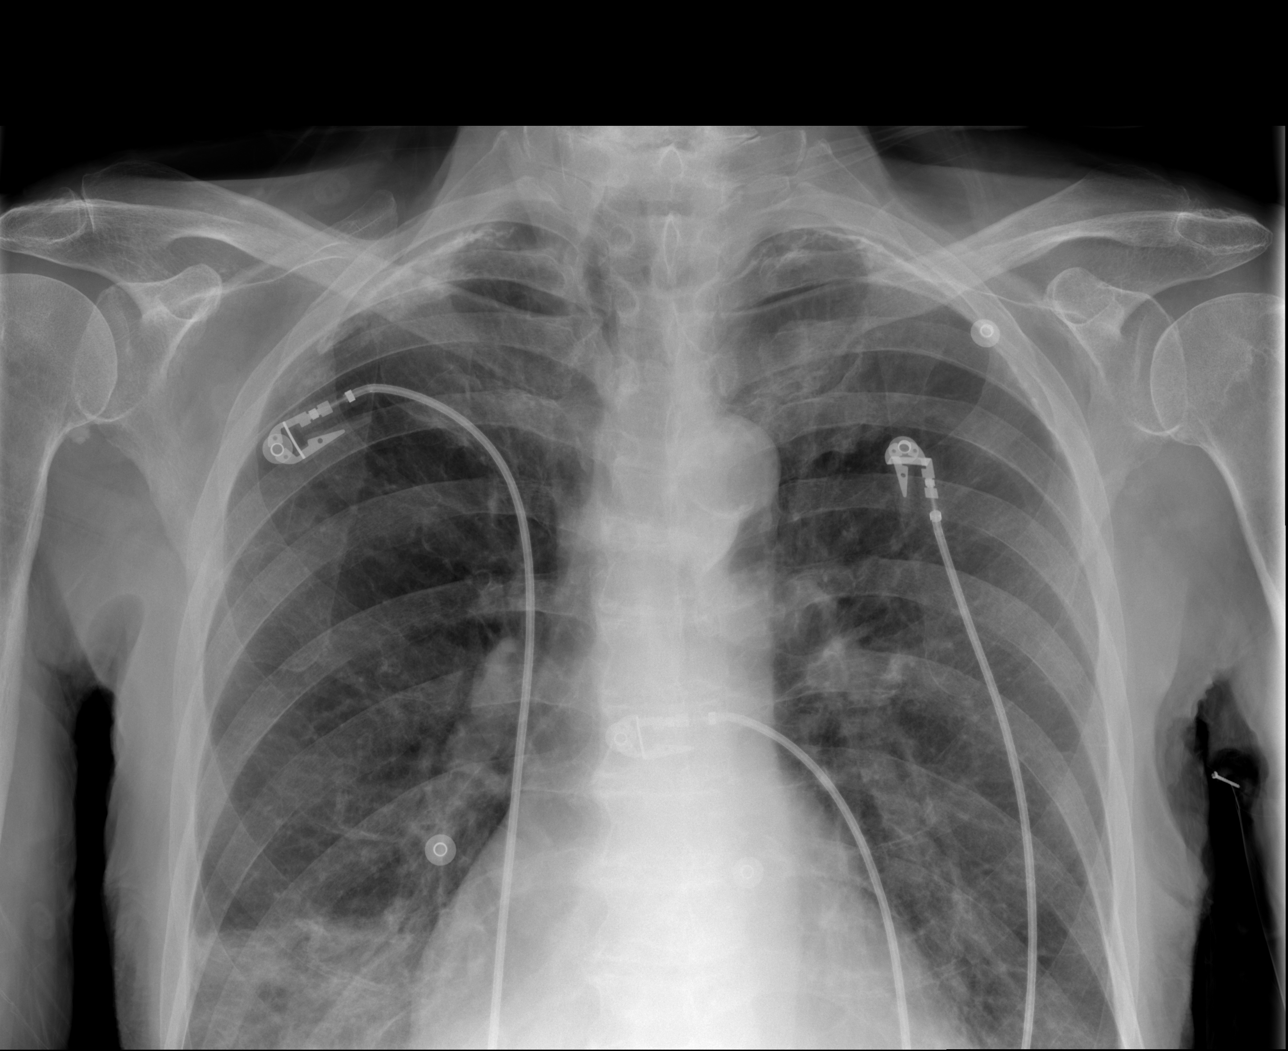
[im 3/3]
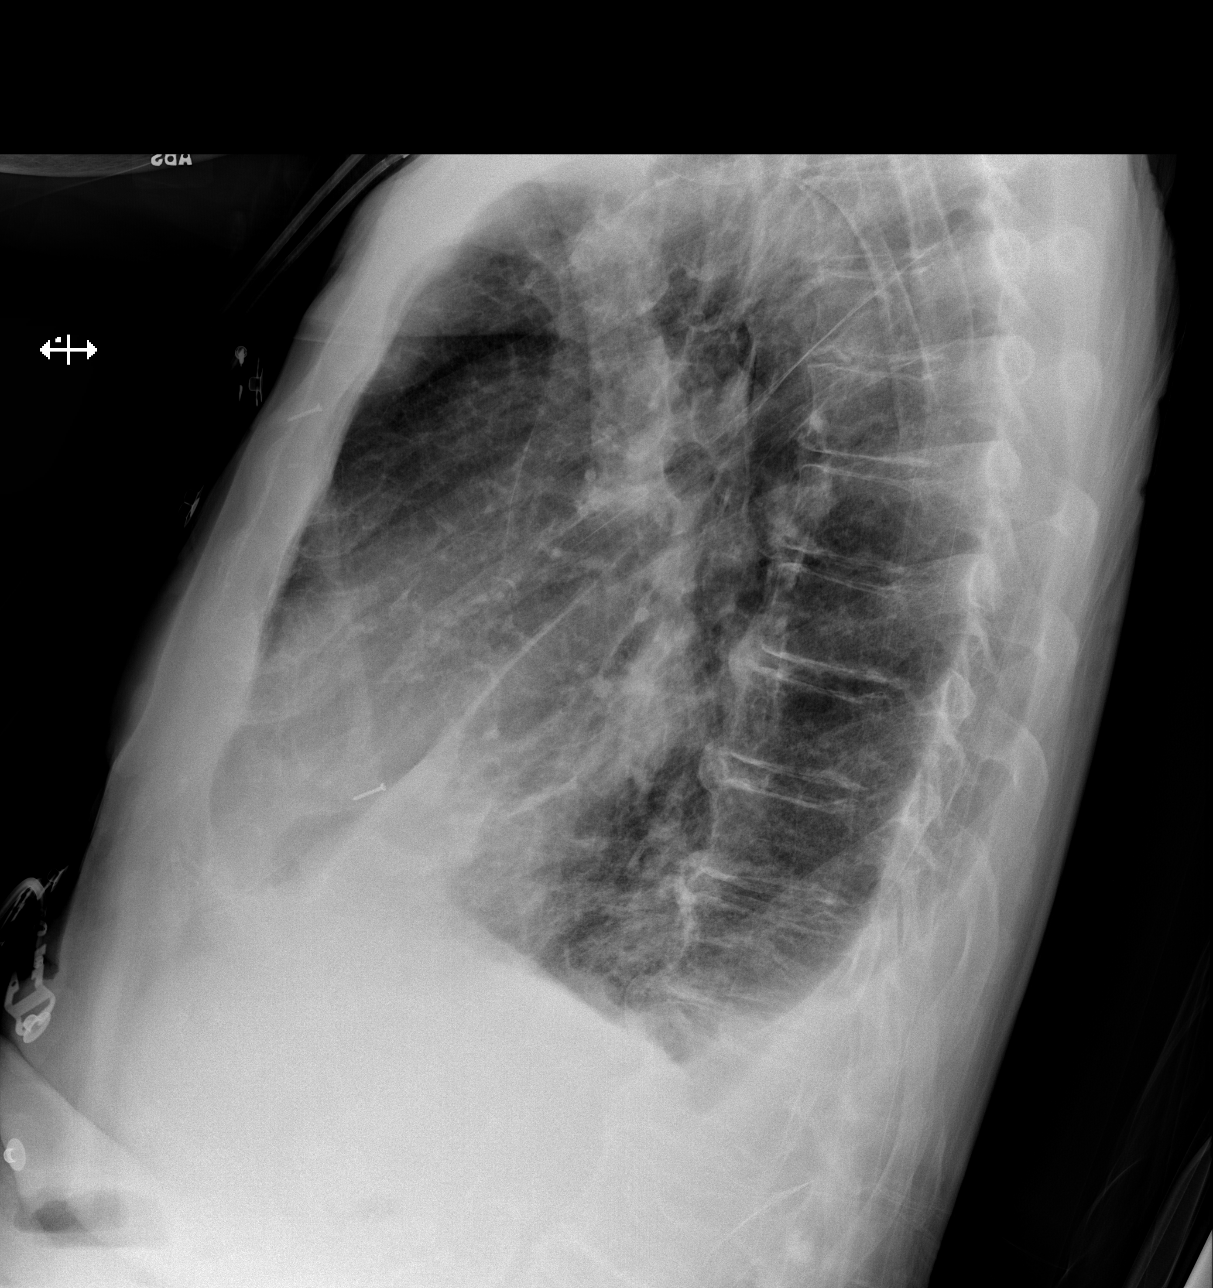

[3 of 3 positions shown; findings below may reference images not displayed]

PROCEDURE:     DXR - DXR CHEST PA (OR AP) AND LATERAL  - September 23, 2012  [DATE]

RESULT:     Cardiomegaly. Bilateral pulmonary interstitial prominence with
bilateral pleural effusions. Findings consistent with congestive heart
failure. Atelectasis and/or pneumonia lung bases cannot be excluded.
Possible COPD.
IMPRESSION: Congestive heart failure with pulmonary edema. Superimposed
pneumonia cannot be excluded.

## 2015-02-09 NOTE — Discharge Summary (Signed)
PATIENT NAME:  Bruce Vaughan, Bruce Vaughan MR#:  [id number] DATE OF BIRTH:  03/07/1921  DATE OF ADMISSION:  08/01/2012 DATE OF DISCHARGE:  08/04/2012  REASON FOR ADMISSION: Feeling weak and fatigued with no energy for several days.   FINAL DIAGNOSES:  1. Congestive heart failure exacerbation, mixed systolic and diastolic failure/acute exacerbation.  2. Chronic atrial fibrillation.  3. Chronic anticoagulation with Coumadin.  4. Gastroesophageal reflux.  5. Hypertension.  6. Hyperlipidemia.  7. Coronary artery disease with medical management.  8. Hyperlipidemia.  9. History of prostate cancer.  10. Chronic disease anemia.  11. History of skin cancer. 12. Hypokalemia, now resolved.  13. Acute kidney failure/azotemia with creatinine at discharge of 1.49.  14. Mildly elevated troponin due to demand ischemia.   DISCHARGE MEDICATIONS: 1. Zocor 40 mg once daily.  2. Diltiazem 120 mg ER formulation every 24 hours.  3. Coumadin 4 mg once daily.  4. Ferrous gluconate 324 mg once daily.  5. FiberChoice. 6. Lisinopril 5 mg once daily.  7. Multivitamins once a day.  8. Niacin 750 mg extended-release once a day.  9. Protonix 40 mg once a day.  10. Digoxin 125 mcg once daily.  11. Furosemide 20 mg once daily.  12. K-Tab 10 mEq extended release once daily.   DISPOSITION: Home.   DISCHARGE FOLLOWUP: Followup with Dr. Rockey Situ in the next seven days and Dr. Derrel Nip in 7 to 14 days.   RECOMMENDATIONS:  The patient is to have strict low salt diet. Daily weights before eating and after urinating. If there is a change of more than 3 pounds within consecutive days, the patient is advised to call his cardiology office and inform them of the situation.   HOSPITAL COURSE: Bruce Vaughan is a very nice 81 year old gentleman who has history of chronic atrial fibrillation, hypertension, hyperlipidemia, and coronary artery disease. He was admitted on 08/01/2012 with the chief complaint of feeling very weak and fatigued with  low energy for several days. 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 He was in the company of his wife at the moment of his admission, but he was not really knowing what was going on. He has been getting tired very easily and not functioning as good as he always does. In the ER, his chest x-ray showed some pulmonary congestion, his BNP was elevated above 14,000, and the patient was not really requiring much oxygen or any oxygen supplementation and he was not complaining of specific shortness of breath. He was very fatigued when getting around. During this hospitalization, the patient was put on Lasix twice daily and he did have really good diuresis, starting to feel much better. He had mild elevation of his creatinine up to 1.49 at discharge for what his Lasix was reduced to once daily. At this moment, he is actually doing very good and he is able to go back home. 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 He thinks that he can deal with the situation by himself. We are going to notify the nurses at [facility] so they can check his blood pressure and help him if he needs any assistance. Education about congestive heart failure has been given, diet and medication management. Prescriptions have been given to the patient.   TIME SPENT: I spent over 37 minutes with this patient. ____________________________ Zion Sink, MD rsg:slb D: 08/04/2012  10:32:00 ET     T: 08/04/2012 14:43:32 ET       JOB#: [id number] cc: [place] Sink, MD, <Dictator> Deborra Medina, MD Cristi Loron MD ELECTRONICALLY SIGNED 08/06/2012 16:52

## 2015-02-09 NOTE — H&P (Signed)
PATIENT NAME:  Bruce Vaughan, Bruce Vaughan MR#:  [id number] DATE OF BIRTH:  1921/03/14  DATE OF ADMISSION:  08/01/2012  PRIMARY CARE PHYSICIAN: Deborra Medina, MD   CHIEF COMPLAINT: Feeling weak, fatigued, and low in energy few the last several days.   HISTORY OF PRESENT ILLNESS: Bruce Vaughan is an 81 year old Caucasian gentleman with past medical history of coronary artery disease managed medically, history of chronic atrial fibrillation, on Coumadin, hypertension and gastroesophageal reflux disease, comes to the Emergency Room accompanied by his wife with the above-mentioned chief complaint. The patient noted that he has been feeling very low in energy, getting tired easily and overall not functioning as good as he normally does, and he decided to come to the Emergency Room. During the routine work-up, his chest x-ray showed some pulmonary vascular congestion, and his BNP was elevated to 14,000. The patient denies much shortness of breath but keeps reiterating that he is low in energy and feeling very tired and fatigued. He denies any chest tightness, chest pain or any leg swelling. He is being admitted for acute congestive heart failure, suspected systolic, with acute on chronic atrial fibrillation and elevated troponin which likely is demand ischemia from congestive heart failure.   PAST MEDICAL/SURGICAL HISTORY:  1. History of chronic atrial fibrillation, on Coumadin.  2. Gastroesophageal reflux disease:  3. Hypertension.  4. Hyperlipidemia.  5. Coronary artery disease, status post attempt to place cardiac stent, however was not successful and hence managed medically.  6. Hyperlipidemia.  7. Prostate cancer.  8. Chronic anemia.  9. Skin cancer, removal.  10. History of tonsillectomy and adenoidectomy..  11. Appendectomy.  12. Herniorrhaphy.  13. Thyroid partial resection, biopsy showed it was nonmalignant.   ALLERGIES: No known drug allergies.   SOCIAL HISTORY: He lives at [facility] at [place] with  his wife. He is a nonsmoker, nonalcoholic. He is a retired Lexicographer.   FAMILY HISTORY: Positive for hypertension.   REVIEW OF SYSTEMS: CONSTITUTIONAL: Positive for fatigue, weakness. No fever. EYES: No blurred vision or double vision. No glaucoma. ENT: No tinnitus, ear pain, hearing loss. RESPIRATORY: No cough, wheeze, hemoptysis, chronic obstructive pulmonary disease. CARDIOVASCULAR: No chest pain, orthopnea, edema. Positive for high blood pressure. GASTROINTESTINAL: No nausea. Positive for gastroesophageal reflux disease. No abdominal pain. No rectal bleed. GU: No dysuria or hematuria. ENDOCRINE: No polyuria or nocturia. HEMATOLOGY: No anemia or easy bruising. SKIN: No acne or rash. MUSCULOSKELETAL: Positive for arthritis. NEUROLOGIC: No cerebrovascular accident or  transient ischemic attack. PSYCHIATRIC: No anxiety or depression. All other systems are reviewed and negative.   PHYSICAL EXAMINATION:  GENERAL: The patient is awake, alert, oriented x3, not in acute distress.   VITAL SIGNS: He is afebrile, pulse is 106 to 110, atrial fibrillation, respirations 20, blood pressure 132/65, sats are 97% on room air.   HEENT: Atraumatic, normocephalic. Pupils are equal, round, and reactive to light and accommodation. Extraocular movements are intact. Oral mucosa is moist.   NECK: Supple. No JVD. No carotid bruit.   LUNGS: Clear to auscultation bilaterally. Decreased breath sounds in the bases. No rales, rhonchi, respiratory distress, or labored breathing.   CARDIOVASCULAR: Irregularly irregular heart rhythm. No murmur heard. PMI not lateralized. Chest is nontender.   EXTREMITIES: Good pedal pulses, good femoral pulses. No lower extremity edema.   ABDOMEN: Soft, benign, and nontender. No organomegaly. Positive bowel sounds.   NEUROLOGIC: Grossly intact cranial nerves II through XII. No motor or sensory deficit.  PSYCHIATRIC: The patient is awake, alert, and oriented x3.  LABORATORY, DIAGNOSTIC  AND RADIOLOGICAL DATA: EKG shows rapid atrial fibrillation with a heart rate of 110. Chest x-ray shows hyperinflation consistent with chronic obstructive pulmonary disease. Fibrotic changes, patchy density in left upper lobe could represent infiltrate versus atelectasis. BNP is 14,744. Troponin is 0.42. Glucose is 100, BUN is 28, creatinine is 1.3, hemoglobin and hematocrit is 14.3 and 40.6. PT-INR is 23.6 and 2.1. Urinalysis is negative for urinary tract infection.   ASSESSMENT: Bruce Vaughan is an 81 year old with:   1. Progressive increase in weakness, easy fatigability, elevated BNP, chest x-ray with mild pulmonary vascular congestion consistent with new onset CHF, acute, unknown ejection fraction: We will admit the patient to a telemetry floor, start IV Lasix 20 b.i.d., ins and outs daily, daily creatinine, adjust Lasix dose according to the patient's clinical symptoms. The patient may not need higher doses at this time. We will get echo of the heart. Dr. Rockey Situ will see the patient in consultation. Cycle cardiac enzymes x3. Check lipid profile.  2. Elevated troponin: Likely due to demand ischemia. The patient denies any chest pain. This does not appear to be acute coronary syndrome.  3. Acute on chronic atrial fibrillation: On Coumadin with therapeutic INR. We will continue Coumadin home dose. Continue diltiazem home dose.  4. Hyperlipidemia: On statins.  5. Coronary artery disease: Manage medically. The patient reports he had an attempt to place a stent in one of his blood vessels in [place], [place] by one of the cardiologists, however, they were unsuccessful, and hence it has been treated medically. His cardiologist is Dr. Greta Doom in [place], phone number is [phone].  Another phone number is [phone].  6. Deep vein thrombosis prophylaxis: The patient is already on Coumadin with therapeutic INR.  7. Gastroesophageal reflux disease: We will continue PPIs.   Further work-up according to  the patient's clinical course. Hospital admission plan was discussed with the patient and the patient's wife.   CODE STATUS:  The patient is  a FULL CODE.        TIME SPENT: 55 minutes.  ____________________________ Hart Rochester Posey Pronto, MD sap:cbb D: 08/01/2012 19:11:33 ET T: 08/02/2012 06:11:18 ET JOB#: [id number]  cc: Alejos Reinhardt A. Posey Pronto, MD, <Dictator> Deborra Medina, MD Ilda Basset MD ELECTRONICALLY SIGNED 08/02/2012 20:59

## 2015-02-09 NOTE — Consult Note (Signed)
General Aspect 81 year old Caucasian gentleman with past medical history of CAD, history of chronic atrial fibrillation, on Coumadin, hypertension and gastroesophageal reflux disease, comes to the Emergency Room with weakness, fatigue, chest discomfort. Cardiology was consulted for chest pain, elevated BNP, concern for CHF.  Per the patient, he has been feeling low in energy, getting tired easily and overall not functioning as good as he normally does. He denies shortness of breath, chest tightness, chest pain or any leg swelling.     chest x-ray showed some pulmonary vascular congestion, and his BNP was elevated to 14,000. Heart rate overnight showing atrial fibrillation with rates 80 to 120, high end of range with exertion.    PAST MEDICAL/SURGICAL HISTORY:  1. History of chronic atrial fibrillation, on Coumadin.  2. Gastroesophageal reflux disease:  3. Hypertension.  4. Hyperlipidemia.  5. Coronary artery disease, status post attempt to place cardiac stent, however was not successful and hence managed medically. seen in [place] 6. Hyperlipidemia.  7. Prostate cancer.  8. Chronic anemia.  9. Skin cancer, removal.  10. History of tonsillectomy and adenoidectomy..  11. Appendectomy.  12. Herniorrhaphy.  13. Thyroid partial resection, biopsy showed it was nonmalignant.   Physical Exam:   GEN well developed, well nourished, no acute distress, thin    HEENT red conjunctivae, hearing intact to voice    NECK supple     RESP normal resp effort  clear BS  dull at the bases     CARD Irregular rate and rhythm  No murmur     ABD denies tenderness  soft     LYMPH negative neck    EXTR negative edema    SKIN normal to palpation    NEURO motor/sensory function intact    PSYCH alert, A+O to time, place, person, good insight   Review of Systems:   Subjective/Chief Complaint fatigue, low energy    General: Fatigue  Weakness     Skin: No Complaints     ENT: No Complaints      Eyes: No Complaints     Neck: No Complaints     Respiratory: No Complaints     Cardiovascular: No Complaints     Gastrointestinal: low appetite     Genitourinary: No Complaints     Vascular: No Complaints     Musculoskeletal: No Complaints     Neurologic: No Complaints     Hematologic: No Complaints     Endocrine: No Complaints     Psychiatric: No Complaints     Review of Systems: All other systems were reviewed and found to be negative     Medications/Allergies Reviewed Medications/Allergies reviewed         Admit Diagnosis:   ACUTE CHF: 02-Aug-2012, Active, ACUTE CHF  Home Medications: Medication Instructions Status  Zocor 40 mg oral tablet 1 tab(s) orally once a day (at bedtime) Active  Diltiazem Hydrochloride ER 120 mg/24 hours oral capsule, extended release 1 cap(s) orally once a day Active  hydrochlorothiazide 25 mg oral tablet 1 tab(s) orally once a day Active  Coumadin 4 mg oral tablet 1 tab(s) orally once a day (in the evening) Active  ferrous gluconate 324 mg (38 mg elemental iron) oral tablet 1 tab(s) orally once a day Active  Fiber Choice 1.5 g oral tablet, chewable 1 tab(s) orally once a day (in the evening) Active  lisinopril 5 mg oral tablet 1 tab(s) orally once a day (in the evening) Active  multivitamin 1 tab(s) orally once a day  Active  niacin 750 mg oral tablet, extended release 1 tab(s) orally once a day (at bedtime) Active  Protonix 40 mg oral delayed release tablet 1 tab(s) orally once a day Active   Lab Results:  Hepatic:  10-Oct-13 12:42    Bilirubin, Total  2.2   Alkaline Phosphatase 87   SGPT (ALT) 33   SGOT (AST) 37   Total Protein, Serum 7.8   Albumin, Serum 4.1  Routine Chem:  10-Oct-13 12:42    Result Comment TROPONIN - RESULTS VERIFIED BY REPEAT TESTING.  - CALLED TO MOLLY TODD AT 1358  - ON 08/01/12 BY GA  - READ-BACK PROCESS PERFORMED.  Result(s) reported on 01 Aug 2012 at 02:01PM.   B-Type Natriuretic Peptide [facility])   [phone] (Result(s) reported on 01 Aug 2012 at 04:17PM.)   Glucose, Serum  100   BUN  28   Creatinine (comp)  1.31   Sodium, Serum 139   Potassium, Serum 3.6   Chloride, Serum 101   CO2, Serum 27   Calcium (Total), Serum 9.2   Osmolality (calc) 283   eGFR (African American)  56   eGFR (Non-African American)  48 (eGFR values <31m/min/1.73 m2 may be an indication of chronic kidney disease (CKD). Calculated eGFR is useful in patients with stable renal function. The eGFR calculation will not be reliable in acutely ill patients when serum creatinine is changing rapidly. It is not useful in  patients on dialysis. The eGFR calculation may not be applicable to patients at the low and high extremes of body sizes, pregnant women, and vegetarians.)   Anion Gap 11  11-Oct-13 04:32    Result Comment troponin - RESULTS VERIFIED BY REPEAT TESTING.  - prev c/ @ 1358 08/01/12 mpg  Result(s) reported on 02 Aug 2012 at 05:25AM.   Cholesterol, Serum 108   Triglycerides, Serum 60   HDL (INHOUSE) 49   VLDL Cholesterol Calculated 12   LDL Cholesterol Calculated 47 (Result(s) reported on 02 Aug 2012 at 04:55AM.)  Cardiac:  10-Oct-13 12:42    CK, Total 86   CPK-MB, Serum 2.3 (Result(s) reported on 01 Aug 2012 at 01:31PM.)   Troponin I  0.42 (0.00-0.05 0.05 ng/mL or less: NEGATIVE  Repeat testing in 3-6 hrs  if clinically indicated. >0.05 ng/mL: POTENTIAL  MYOCARDIAL INJURY. Repeat  testing in 3-6 hrs if  clinically indicated. NOTE: An increase or decrease  of 30% or more on serial  testing suggests a  clinically important change)  11-Oct-13 04:32    CK, Total 82   CPK-MB, Serum 1.9 (Result(s) reported on 02 Aug 2012 at 05:14AM.)   Troponin I  0.38 (0.00-0.05 0.05 ng/mL or less: NEGATIVE  Repeat testing in 3-6 hrs  if clinically indicated. >0.05 ng/mL: POTENTIAL  MYOCARDIAL INJURY. Repeat  testing in 3-6 hrs if  clinically indicated. NOTE: An increase or decrease  of 30% or more on  serial  testing suggests a  clinically important change)  Routine Coag:  10-Oct-13 12:42    Prothrombin  23.6   INR 2.1 (INR reference interval applies to patients on anticoagulant therapy. A single INR therapeutic range for coumarins is not optimal for all indications; however, the suggested range for most indications is 2.0 - 3.0. Exceptions to the INR Reference Range may include: Prosthetic heart valves, acute myocardial infarction, prevention of myocardial infarction, and combinations of aspirin and anticoagulant. The need for a higher or lower target INR must be assessed individually. Reference: The Pharmacology and Management of the Vitamin  K  antagonists: the seventh ACCP Conference on Antithrombotic and Thrombolytic Therapy. UQJFH.5456 Sept:126 (3suppl): [id number]. A HCT value >55% may artifactually increase the PT.  In one study,  the increase was an average of 25%. Reference:  "Effect on Routine and Special Coagulation Testing Values of Citrate Anticoagulant Adjustment in Patients with High HCT Values." American Journal of Clinical Pathology 2006;126:400-405.)  Routine Hem:  10-Oct-13 12:42    WBC (CBC) 8.5   RBC (CBC)  4.00   Hemoglobin (CBC) 14.3   Hematocrit (CBC) 40.6   Platelet Count (CBC) 163 (Result(s) reported on 01 Aug 2012 at 01:08PM.)   MCV  101   MCH  35.8   MCHC 35.3   RDW 14.2   EKG:   Interpretation EKG shows atrial fibrillation, rate 110 bpm, pvcs, LAFB    No Known Allergies:   Vital Signs/Nurse's Notes:  **Vital Signs.:   11-Oct-13 07:52   Vital Signs Type Routine   Temperature Temperature (F) 97.7   Celsius 36.5   Pulse Pulse 102   Respirations Respirations 20   Systolic BP Systolic BP 90   Diastolic BP (mmHg) Diastolic BP (mmHg) 59   Mean BP 69   Pulse Ox Activity Level  At rest   Oxygen Delivery Room Air/ 21 %     Impression 81 year old Caucasian gentleman with past medical history of CAD, history of chronic atrial fibrillation, on  Coumadin, hypertension and gastroesophageal reflux disease, comes to the Emergency Room with weakness, fatigue, chest discomfort. Cardiology was consulted for chest pain, elevated BNP, concern for CHF.  1) Fatigue/malaise elevated BNP on arrival echo showing moderate sized left pleural effusion, depressed EF 35 to 40%, at least mild pulmonary HTN. --echo and clinical exam suggests acute systolic and diastolic CHF this would explain pleural effusion.  Likely contribution for underlying chronic atrial fibrillation, rate mildly elevated --would continue gentle lasix BID with close monitoring of renal function. Hold HCTZ, continue diltiazem and lisinopril  2) Elevated cardiac enz, CAD: previous workup in [place], by report, stent could not be deployed. cardiac enz stable, borderline elevated troponin 0.4 (no change), does not suggest active ischemia Would continue aspirin, statin  3) Pleural effusion: small to moderate size on echo on left, small on clinical exam likely from heart failure no thoracentesis needed.   4) Atrial fibrillation: Will start digoxin 0.125 mg daily for rate control   Electronic Signatures: Ida Rogue (MD)  (Signed 11-Oct-13 11:06)  Authored: General Aspect/Present Illness, History and Physical Exam, Review of System, Health Issues, Home Medications, Labs, EKG , Allergies, Vital Signs/Nurse's Notes, Impression/Plan   Last Updated: 11-Oct-13 11:06 by Ida Rogue (MD)

## 2015-02-09 NOTE — Discharge Summary (Signed)
PATIENT NAME:  Bruce Vaughan, Bruce Vaughan MR#:  [id number] DATE OF BIRTH:  12-10-1920  DATE OF ADMISSION:  09/24/2012 DATE OF DISCHARGE:  09/25/2012  PRIMARY CARE PHYSICIAN: Deborra Medina, MD  CARDIOLOGIST: Ida Rogue, MD  FINAL DIAGNOSES:  1. Acute combined systolic and diastolic congestive heart failure.  2. Syncope.  3. Atrial fibrillation.  4. Hypertension.  5. Gastroesophageal reflux disease.  6. Hyperlipidemia.  7. Elevated cardiac enzymes.   DISCHARGE MEDICATIONS: 1. Ferrous gluconate 324 mg one tablet twice a day. 2. Niacin 750 mg daily.  3. Digoxin 125 mcg daily.  4. Lisinopril 5 mg daily.  5. Diltiazem extended-release 124 mg daily. 6. Simvastatin 40 mg daily.  7. Protonix 40 mg daily.  8. Fiber laxative 625 mg daily.  9. Furosemide 20 mg daily.  10. Klor-Con 10 mEq daily.  11. Warfarin 4 mg at bedtime.   NOTE: Stop taking hydrochlorothiazide.  HOME HEALTH: Refused.  DIET: Low sodium diet, less than 2300 mg of sodium per day. Diet consistency regular.   ACTIVITY: As tolerated.   DISCHARGE FOLLOWUP/INSTRUCTIONS: Followup with Dr. Rockey Situ, cardiology, in one week, in two weeks with Dr. Derrel Nip. If weight is below 146, can hold Lasix and potassium for that day. Recommend checking a BMP in one week.   REASON FOR ADMISSION: The patient was admitted initially as an observation, on 09/24/2012, for syncope, and was changed to admission on 09/24/2012 with congestive heart failure as the primary diagnosis.   LABORATORY, DIAGNOSTIC AND RADIOLOGIC DATA: INR 2.6. Troponin borderline at 0.35. Digoxin 1.18, sodium 144, potassium 4.0, chloride 111, CO2 25, and total bilirubin 1.7. Other liver function tests normal. Creatinine 1.22, BUN 31, and glucose 138. White blood cell count 6.9, hemoglobin and hematocrit 12.6 and 37.6, and platelet count 150   EKG showed atrial fibrillation, right bundle branch block, left fascicular block.   Chest x-ray showed congestive heart failure.   CT scan of  the chest showed congestive heart failure, pulmonary edema, and bilateral pleural effusions.   Urinalysis: 1+ blood.  Creatinine upon discharge 1.32. INR upon discharge 2.6.   HOSPITAL COURSE PER PROBLEM LIST:  1. For the patient's acute combined systolic and diastolic congestive heart failure, the patient was seen in consultation by Dr. Rockey Situ who recommended IV Lasix. The patient's lungs were clear upon discharge. The patient did have bilateral lower extremity edema. The patient was taking as needed Lasix at home and it was advised that he take 20 mg of Lasix daily with his potassium to keep fluid management moving forward. The patient can hold his Lasix if his weight goes below 146. Dr. Rockey Situ will see the patient in close clinical follow-up to compare weights in his office. The patient was feeling well with regards to his breathing.  2. Near syncope versus syncope. He was out on his walker and had to sit down and whether he went out or just had to sit down is unclear. The patient had no further symptoms of dizziness during the hospital course.  3. Atrial fibrillation. The patient is rate controlled with his diltiazem and digoxin. No beta blocker at this time secondary to already on two medications that can control heart rate. The patient is anticoagulated with Coumadin and is therapeutic.  4. For the patient's hypertension, blood pressure is on the lower side here. Hydrochlorothiazide was stopped.  5. Gastroesophageal reflux disease. He is on a PPI.  6. Hyperlipidemia. The patient is on niacin and simvastatin.  TIME SPENT ON DISCHARGE: 35 minutes.  ____________________________ Delfino Lovett  Adella Hare, MD rjw:slb D: 09/25/2012 15:41:39 ET T: 09/26/2012 10:04:34 ET JOB#: [id number]  cc: Tana Conch. Leslye Peer, MD, <Dictator> Deborra Medina, MD Minna Merritts, MD Marisue Brooklyn MD ELECTRONICALLY SIGNED 09/26/2012 17:16

## 2015-02-09 NOTE — Consult Note (Signed)
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 Cardiology was consulted for elevated cardiac enzymes and heart failure.  He reports progressive weakness over last 6 months. Poor oral intake over last week with painful swallowing. He was switched from Lasix to [facility] recently for weight gain.  He was noted to be in acute on chronic renal failure with elevated Digoxin level and TnI :2. He denies chest pain.  Echocardiogram 07/2012 showed ejection fraction 35-45%, mildly dilated left atrium, mild to moderate MR, mild to moderate TR, right ventricular systolic pressure 09-60 mmHg.    Present Illness . PAST SURGICAL HISTORY:  1. Tonsillectomy and adenoidectomy. 2. Partial resection of the thyroid, non-malignant. 3. Hemorrhoidectomy.  4. Appendectomy.   ALLERGIES: No known drug allergies.   FAMILY HISTORY: Hypertension. Negative for coronary artery disease. Negative for cancer.   SOCIAL HISTORY: The patient lives at the [facility] with his wife. 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 He does not drink.   Physical Exam:   GEN well nourished, no acute distress, critically ill appearing    HEENT red conjunctivae    NECK supple  No masses  carotid bruit    RESP normal resp effort  diminished BS at base    CARD Irregular rate and rhythm  Murmur    Murmur Systolic    Systolic Murmur Out flow    ABD denies tenderness  soft    LYMPH negative neck    EXTR positive edema, 1+ puitting of LE extr.    SKIN  normal to palpation    NEURO motor/sensory function intact    PSYCH alert, A+O to time, place, person, good insight   Review of Systems:   Subjective/Chief Complaint fatigue, SOB, edema, "low energy"    General: Fatigue  Weakness    Skin: No Complaints    ENT: No Complaints    Eyes: No Complaints    Neck: No Complaints    Respiratory: Short of breath    Cardiovascular: Dyspnea    Gastrointestinal: No Complaints    Genitourinary: No Complaints    Vascular: No Complaints    Musculoskeletal: No Complaints    Neurologic: No Complaints    Hematologic: No Complaints    Endocrine: No Complaints    Psychiatric: No Complaints    Review of Systems: All other systems were reviewed and found to be negative    Medications/Allergies Reviewed Medications/Allergies reviewed   Lab Results: Hepatic:  31-Dec-13 12:18    Bilirubin, Total  2.4   Alkaline Phosphatase 103   SGPT (ALT) 59   SGOT (AST)  75   Total Protein, Serum 6.9   Albumin, Serum 3.4  TDMs:  31-Dec-13 12:18    Digoxin, Serum  2.6 (Therapeutic range for digoxin in patients with atrial fibrillation: 0.8 - 2.0 ng/mL. In patients with congestive heart failure a therapeutic range of 0.5 - 0.8 ng/mL is suggested as higher levels are associated with an increased risk of toxicity without clear evidence of enhanced efficacy. Digoxin toxicity is commonly associated with serum levels > 2.0 ng/mL but may occur with lower  levels, including those in the therapeutic range. Blood samples should be obtained 6-8 hours after administration to assure a reasonable volume of distribution.)  Routine Chem:  31-Dec-13 12:18    B-Type Natriuretic Peptide [facility])  [phone] (Result(s) reported on 22 Oct 2012 at 01:58PM.)   Glucose, Serum  140   BUN  55   Creatinine (comp)  2.14   Sodium, Serum 137   Potassium, Serum 4.3   Chloride, Serum 99   CO2, Serum 26   Calcium (Total), Serum 9.2   Osmolality (calc) 291   eGFR (African  American)  31   eGFR (Non-African American)  26 (eGFR values <58mL/min/1.73 m2 may be an indication of chronic kidney disease (CKD). Calculated eGFR is useful in patients with stable renal function. The eGFR calculation will not be reliable in acutely ill patients when serum creatinine is changing rapidly. It is not useful in  patients on dialysis. The eGFR calculation may not be applicable to patients at the low and high extremes of body sizes, pregnant women, and vegetarians.)   Result Comment TROPONIN - RESULTS VERIFIED BY REPEAT TESTING.  Result(s) reported on 22 Oct 2012 at 01:25PM.   Result Comment DIGOXIN - RESULTS VERIFIED BY REPEAT TESTING.  - NOTIFIED OF CRITICAL VALUE  - CALLED TO KIM GAULT @ 1351 ON 10/22/2012  - CAF  - READ-BACK PROCESS PERFORMED.  Result(s) reported on 22 Oct 2012 at 01:58PM.   Anion Gap 12  Cardiac:  31-Dec-13 12:18    CK, Total 91   CPK-MB, Serum  4.0 (Result(s) reported on 22 Oct 2012 at 01:25PM.)   Troponin I  2.00 (0.00-0.05 0.05 ng/mL or less: NEGATIVE  Repeat testing in 3-6 hrs  if clinically indicated. >0.05 ng/mL: POTENTIAL  MYOCARDIAL INJURY. Repeat  testing in 3-6 hrs if  clinically indicated. NOTE: An increase or decrease  of 30% or more on serial  testing suggests a  clinically important change)  Routine Coag:  31-Dec-13 12:18    Prothrombin  33.5   INR 3.3 (INR reference interval applies to patients on anticoagulant therapy. A single INR therapeutic range for coumarins is not optimal for all indications; however, the suggested range for most indications is 2.0 - 3.0. Exceptions to the INR Reference Range may include: Prosthetic heart valves, acute myocardial infarction, prevention of myocardial infarction, and combinations of aspirin and anticoagulant. The need for a higher or lower target INR must be assessed individually. Reference: The Pharmacology and Management of the Vitamin K  antagonists: the seventh ACCP Conference on  Antithrombotic and Thrombolytic Therapy. BTDHR.4163 Sept:126 (3suppl): [id number]. A HCT value >55% may artifactually increase the PT.  In one study,  the increase was an average of 25%. Reference:  "Effect on Routine and Special Coagulation Testing Values of Citrate Anticoagulant Adjustment in Patients with High HCT Values." American Journal of Clinical Pathology 2006;126:400-405.)  Routine Hem:  31-Dec-13 12:18    WBC (CBC)  10.7   RBC (CBC)  4.23   Hemoglobin (CBC) 15.2   Hematocrit (CBC) 44.1   Platelet Count (CBC)  141 (Result(s) reported on 22 Oct 2012 at 12:36PM.)   MCV  104   MCH  35.8   MCHC 34.4   RDW  16.8   EKG:   Interpretation EKG shows atrial fibrillation,  RBBB, LAFB, ST depression (likely digoxin effect)    No Known Allergies:   Vital Signs/Nurse's Notes: **Vital Signs.:   31-Dec-13 16:20   Vital Signs Type Admission   Temperature Temperature (F) 97.4  Celsius 36.3   Temperature Source axillary   Pulse Pulse 61   Respirations Respirations 18   Systolic BP Systolic BP 188   Diastolic BP (mmHg) Diastolic BP (mmHg) 74   Mean BP 86   Pulse Ox Activity Level  At rest   Oxygen Delivery 2L     Impression 1) Acute on CKD: Agree with holding diuretics/ ACE I and gentle hydration. This is likely due to poor oral intake.   2) Mild Digoxin toxicity: I think we should avoid this medication in future.   3)Atrial fibrillation -  Chronic.  Stopped Digoxin. I switched Diltiazem to Metoprolol given cardiomyopathy.  Hold Warfarin due to elevated INR.   4) NSTEMI: likely type 2 supply demand ischemia. Medical therapy for now.   Electronic Signatures: Kathlyn Sacramento (MD)  (Signed 31-Dec-13 18:25)  Authored: General Aspect/Present Illness, History and Physical Exam, Review of System, Labs, EKG , Allergies, Vital Signs/Nurse's Notes, Impression/Plan   Last Updated: 31-Dec-13 18:25 by Kathlyn Sacramento (MD)

## 2015-02-09 NOTE — Consult Note (Signed)
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 Cardiology was consulted for acute on chronic systolic CHF. He was last seen in clinic 10/13.   He reports increasing wekaness, poor energy. Some cough, SOB. Worsening edema.  Weight "has not changed much". He has not been taking lasix on a regular basis as is was suggested in follow up clinic.   On his last admission, digoxin was added for rate control, he was started on diuretics. Initial BNP was 14,000. Heart rate 100 to 120 on a regular basis, worse with exertion. With diuresis, his shortness of breath improved, lungs were clear and he was discharged.  Echocardiogram 07/2012 showed ejection fraction 35-45%, mildly dilated left atrium, mild to moderate MR, mild to moderate TR, right ventricular systolic pressure 84-16 mmHg, moderate sized left pleural effusion he had baseline renal dysfunction in the hospital with creatinine 1.31 on arrival, troponin 0.4  fatigue improved after previous discharge with no significant shortness of breath. He has been taking Lasix daily with low-dose potassium, digoxin daily. HCTZ was held.    Present Illness . PAST SURGICAL HISTORY:  1. Tonsillectomy and adenoidectomy. 2. Partial resection of the thyroid, non-malignant. 3. Hemorrhoidectomy.  4. Appendectomy.   ALLERGIES: No known drug allergies.   FAMILY HISTORY: Hypertension. Negative for coronary artery disease. Negative for cancer.   SOCIAL HISTORY: The patient lives at the [facility] with his wife.  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 He does not drink.   Physical Exam:   GEN well developed, well nourished, no acute distress    HEENT red conjunctivae    NECK supple  No masses  carotid bruit    RESP normal resp effort  crackles  b/l bases    CARD Irregular rate and rhythm  Murmur    Murmur Systolic    Systolic Murmur Out flow    ABD denies tenderness  soft    LYMPH negative neck    EXTR positive edema, 1+ puitting of LE extr.    SKIN normal to palpation    NEURO motor/sensory function intact    PSYCH alert, A+O to time, place, person, good insight   Review of Systems:   Subjective/Chief Complaint fatigue, SOB, edema, "low energy"    General: Fatigue  Weakness    Skin: No Complaints    ENT: No Complaints    Eyes: No Complaints    Neck: No Complaints    Respiratory: Short of breath    Cardiovascular: Dyspnea    Gastrointestinal: No Complaints    Genitourinary: No Complaints    Vascular: No Complaints    Musculoskeletal: No Complaints    Neurologic: No Complaints    Hematologic: No Complaints    Endocrine: No Complaints    Psychiatric: No Complaints    Review of Systems: All other systems were reviewed and found to be negative    Medications/Allergies Reviewed Medications/Allergies reviewed     afib:    hyper lipids:    htn:    attempt cardiac stent insertion.:    prostate CA:    anemia:    skin ca removal:  tna:    appendectomy:    hernia repair:    retro peritoneal prostatectomy:        Admit Diagnosis:   VASAVAGAL NEAR SYNCOPE: 24-Sep-2012, Active, VASAVAGAL NEAR SYNCOPE      Admit Reason:   Vasovagal near syncope: (780.2) 23-Sep-2012, Active, ICD9, Syncope and collapse  Home Medications: Medication Instructions Status  ferrous gluconate 324 mg (38 mg elemental iron) oral tablet 1 tab(s) orally 2 times a day Active  Klor-Con 10 oral tablet, extended release 1 tab(s) orally once a day Active   niacin 750 mg oral tablet, extended release 1 tab(s) orally once a day Active  hydrochlorothiazide 25 mg oral tablet 1 tab(s) orally once a day Active  digoxin 125 mcg (0.125 mg) oral tablet 1 tab(s) orally once a day Active  furosemide 20 mg oral tablet 1 tab(s) orally once a day Active  warfarin dose varies. Active  lisinopril 5 mg oral tablet 1 tab(s) orally once a day Active  diltiazem 24 hour extended release 124 milligram(s) orally once a day Active  potassium chloride 10 mEq oral tablet, extended release tab(s) orally once a day Active  simvastatin 40 mg oral tablet 1 tab(s) orally once a day (at bedtime) Active  pantoprazole 40 mg oral delayed release tablet 1 tab(s) orally once a day Active  ferrous gluconate 27 milligram(s) orally once a day Active  Fiber Laxative 625 mg oral tablet  orally once a day Active   Lab Results:  Routine Chem:  03-Dec-13 05:15    Glucose, Serum  119   BUN  33   Creatinine (comp) 1.12   Sodium, Serum 144   Potassium, Serum 4.0   Chloride, Serum  110   CO2, Serum 27   Calcium (Total), Serum 9.4   Anion Gap 7   Osmolality (calc) 295   eGFR (African American) >60   eGFR (Non-African American)  58 (eGFR values <46m/min/1.73 m2 may be an indication of chronic kidney disease (CKD). Calculated eGFR is useful in patients with stable renal function. The eGFR calculation will not be reliable in acutely ill patients when serum creatinine is changing rapidly. It is not useful in  patients on dialysis. The eGFR calculation may not be applicable to patients at the low and high extremes of body sizes, pregnant women, and vegetarians.)  Routine Hem:  03-Dec-13 05:15    WBC (CBC) 6.6   RBC (CBC)  3.80   Hemoglobin (CBC) 13.3   Hematocrit (CBC)  39.1   Platelet Count (CBC) 154   MCV  103   MCH  35.0   MCHC 34.0   RDW  16.1   Neutrophil % 58.7   Lymphocyte % 30.9   Monocyte % 8.9   Eosinophil % 0.7   Basophil % 0.8   Neutrophil # 3.9    Lymphocyte # 2.0   Monocyte # 0.6   Eosinophil # 0.0   Basophil # 0.1 (Result(s) reported on 24 Sep 2012 at 05:48AM.)   EKG:   Interpretation EKG shows atrial fibrillation, rate 77 bpm, RBBB, LAFB    No Known Allergies:   Vital Signs/Nurse's Notes: **Vital Signs.:   03-Dec-13 09:00   Vital Signs Type Q 4hr   Pulse Pulse 84   Pulse source if not from Vital Sign Device per Telemetry Clerk   Respirations Respirations 20   Systolic BP Systolic BP 1700  Diastolic BP (mmHg) Diastolic BP (mmHg) 58   Mean BP 74   Pulse Ox % Pulse Ox %  94   Pulse Ox Activity Level  At rest   Oxygen Delivery Room Air/ 21 %   Telemetry pattern Cardiac Rhythm Atrial fibrillation; pattern reported by Telemetry Clerk     Impression 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   1) Weakness/fatigue secondary to acute on chronic systolic and diastolic CHF Similar presentation as last visit to hospital Pleural effusion on CXR, BNP 14,000 Change to Lasix IV Q8   2)Atrial fibrillation -  Chronic atrial fibrillation.   on digoxin and diltiazem.   3) Carotid stenosis, non-symptomatic - Continue warfarin and statin. He's not interested in surgical options. We could discuss percutaneous stenting at a later date.    4) Hyperlipidemia -  Cholesterol is at goal on the current lipid regimen.  No changes to the medications were made.     5) Hypertension -  Would continue current meds, low dose ACE He has refused b-blocker in the past  6)  Elevated cardiac enz: Would trend cardiac enz Likely demand ischemia from cardiac stretch (from CHF) Probably has underlying CAD given severity of carotid disease. Did not want stress testing in the past. Low ef 35%   Electronic Signatures: Ida Rogue (MD)  (Signed 03-Dec-13 09:29)  Authored: General Aspect/Present Illness, History and Physical Exam, Review of System, Past Medical History, Health Issues, Home Medications, Labs, EKG , Allergies, Vital Signs/Nurse's Notes, Impression/Plan   Last Updated: 03-Dec-13 09:29 by Ida Rogue (MD)

## 2015-02-09 NOTE — H&P (Signed)
PATIENT NAME:  Bruce Vaughan, Bruce Vaughan MR#:  [id number] DATE OF BIRTH:  April 04, 1921  DATE OF ADMISSION:  10/22/2012  PRIMARY CARE PHYSICIAN: Dr. Deborra Medina  CARDIOLOGIST: Dr. Ida Rogue  CHIEF COMPLAINT: Generalized weakness, poor p.o. intake and shortness of breath.   HISTORY OF PRESENT ILLNESS: This is an 81 year old male who presents to the Emergency Room due to poor p.o. intake, lethargy and generalized weakness. He also complains of some worsening exertional shortness of breath and also about a 5 to 6-pound weight gain over the past month to 2 months. The patient says that his symptoms have been progressively getting worse and, therefore, he was a bit concerned and came to the ER for further evaluation. In the Emergency Room, the patient was noted to be in acute renal failure with a creatinine of 2 with his baseline being around 0.9. He was also noted to have elevated Dig levels and also noted to have a troponin of 2.0. He denies any chest pain. He does admit to some exertional shortness of breath, also some 2 to 3-pillow orthopnea. He denies any fevers or chills. He does admit to a cough which is nonproductive in nature but no other associated symptoms. Hospitalist services were contacted for further treatment and evaluation.   REVIEW OF SYSTEMS:  CONSTITUTIONAL: No documented fever. Positive weight gain, about 5 pounds, no weight loss.  EYES: No blurry or double vision.  ENT: No tinnitus. No postnasal drip. No redness of the oropharynx.  RESPIRATORY: Positive cough. No wheeze, no hemoptysis. Positive dyspnea.  CARDIOVASCULAR: No chest pain, no orthopnea, no palpitations, no syncope.  GASTROINTESTINAL: No nausea, no vomiting, no diarrhea, no abdominal pain, no melena or hematochezia.  GENITOURINARY: No dysuria, no hematuria.  ENDOCRINE: No polyuria or nocturia. No heat or cold intolerance.  HEMATOLOGIC:  No anemia. No bruising. No bleeding.  INTEGUMENTARY: No rashes. No lesions.   MUSCULOSKELETAL: No arthritis, no swelling, no gout.  NEUROLOGIC: No numbness, no tingling, no ataxia, no seizure-type activity.  PSYCHIATRIC: No anxiety, no insomnia, no ADD.   PAST MEDICAL HISTORY: Consistent with history of combined diastolic and systolic congestive heart failure, history of pulmonary hypertension, history of chronic atrial fibrillation, hyperlipidemia, hypertension, GERD, history of prostate cancer, skin cancer.   ALLERGIES: No known drug allergies.   SOCIAL HISTORY: No smoking. Occasional alcohol use. No illicit drug abuse. He lives at home with his wife.   FAMILY HISTORY: Both mother and father had heart problems. Mother lived to the age of mid 60s and also father to the age of 51.   CURRENT MEDICATIONS: Digoxin 125 mcg daily, diltiazem 30 mg t.i.d., ferrous gluconate 324 mg b.i.d., fiber laxative 62.5 mg daily as needed, lisinopril 5 mg daily, niacin 750 mg daily, Protonix 40 mg daily, potassium 20 mEq daily, simvastatin 40 mg daily, torsemide 10 mg b.i.d. and warfarin 1 mg daily.   PHYSICAL EXAMINATION:  VITAL SIGNS: On admission, temperature is 97.6, pulse 95, respirations 20, blood pressure 107/70, sats 99% on 2 liters.  GENERAL: He is a pleasant-appearing male in no apparent distress.  HEENT: Atraumatic, normocephalic. Extraocular muscles are intact. Pupils are equal and reactive to light. Sclerae are anicteric. No conjunctival injection. No oropharyngeal erythema. He has positive oral thrush.  NECK: Supple. No jugular venous distention. No bruits, no lymphadenopathy or thyromegaly.  HEART: Regular rate and rhythm. He does have a 2/6 systolic ejection murmur heard at the right sternal border.  ABDOMEN: Soft, flat, nontender, nondistended, has good bowel sounds. No hepatosplenomegaly appreciated.  LUNGS: Clear to auscultation bilaterally. No rales, no rhonchi, no wheezes, no dullness to percussion. Negative use of accessory muscles.  EXTREMITIES: No evidence of any  cyanosis, clubbing. He does have +2 pitting edema on the right as compared to the left, +2 pedal and radial pulses bilaterally.  NEUROLOGICAL: He is alert, awake, and oriented x 3 with no focal motor or sensory deficits appreciated bilaterally.  SKIN: Moist and warm with no rash appreciated.  LYMPHATIC: There is no cervical or axillary lymphadenopathy.  LABORATORY AND RADIOLOGICAL DATA:  Serum glucose of 140, BUN 55, creatinine 2.14, sodium 137, potassium 4.3, chloride 99, bicarb 26. LFTs are within normal limits. Troponin 2.0. Digoxin level of 2.6. White cell count 10.7, hemoglobin 15.2, hematocrit 44.1, platelet count 141, INR 1.3.   The patient did have a chest x-ray done which showed congestive heart failure. Findings have progressed from the x-ray of 09/23/2012. Superimposed pneumonia in the lung bases cannot be excluded. The patient also had Doppler of his lower extremity on the right which showed no evidence of any acute deep vein thrombosis.   ASSESSMENT AND PLAN: This is an 81 year old male with a history of combined systolic and diastolic congestive heart failure, pulmonary hypertension, chronic atrial fibrillation, hypertension, hyperlipidemia, gastroesophageal reflux disease, history of prostate cancer, history of coronary artery disease, presents to the hospital due to poor p.o. intake, lethargy, and worsening shortness of breath.   1. Acute renal failure: Baseline creatinine around 0.9 currently at 2.14. This is likely due to poor p.o. intake and dehydration. I will gently hydrate him with IV fluids, follow his BUN and creatinine and urine output, renal dose meds, avoid nephrotoxins, hold his lisinopril for now and hold his torsemide for now.  2. Elevated troponin: His last troponin in December was 0.3, currently elevated at 2.0. He does not complain of having any chest pain. EKG shows no acute ST or T wave changes. I think this is likely related in the setting of demand ischemia. He is  presently hemodynamically stable. Also could be related to poor renal clearance given his creatinine of 2, although I will observe him on telemetry, follow serial cardiac markers. I will get a two-dimensional echo, also get a cardiology consult. I discussed the case with Dr. Fletcher Anon, who will see the patient.  3. History of combined diastolic and systolic congestive heart failure: Clinically, the patient does not appear to be in congestive heart failure. Therefore, I will hold his ACE and diuretics, given his renal failure, and follow him clinically. We will watch his volume status as he will be on fluids.  4. Chronic atrial fibrillation: Currently rate controlled. Continue with his Cardizem, hold the Digoxin given the elevated Dig level and the acute renal failure, also hold his Coumadin for now as his INR is 3.3 and supratherapeutic.  5. Gastroesophageal reflux disease: Continue Protonix.  6. Hyperlipidemia: Continue with simvastatin and niacin.  7. Oral thrush: I will place him on Nystatin swish and swallow every 6 hours.   CODE STATUS:  The patient is a FULL CODE.      The plan was discussed with the family and patient, and they are in agreement.   TIME SPENT: 50 minutes.   ____________________________ Belia Heman. Verdell Carmine, MD vjs:cb D: 10/22/2012 15:43:22 ET T: 10/22/2012 15:55:40 ET JOB#: [id number]  cc: Belia Heman. Verdell Carmine, MD, <Dictator> Henreitta Leber MD ELECTRONICALLY SIGNED 10/22/2012 21:00

## 2015-02-09 NOTE — H&P (Signed)
PATIENT NAME:  Bruce Vaughan, Bruce Vaughan MR#:  [id number] DATE OF BIRTH:  October 21, 1921  DATE OF ADMISSION:  09/23/2012  PRIMARY CARE PHYSICIAN: Dr. Deborra Medina  CARDIOLOGIST:  Dr. Ida Rogue  REFERRING PHYSICIAN: Dr. 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 He was recently admitted here in 07/2012 due to the main complaint of being really tired and fatigued. At that time it was felt that his tiredness was due to new onset congestive heart failure. He had an ejection fraction of 35% to 40% with mild pulmonary hypertension. The patient had an echocardiogram showing  A large pleural effusion and decompensation of his systolic and diastolic failure. The patient comes in today with an episode of near fainting. Apparently he has not been feeling like himself for the past two weeks with progressive tiredness and fatigue, which is exactly the same way he came in last time. He had an episode of near fainting. He states that he was just taking his trash out and he just felt that he could not continue to walk. He sat down. He states that he never had blurry vision or passed out; he just felt like he was about to. At this moment I think he just needs to come because his chest x-ray shows a possible density in the right lower quadrant, which compared with a CT scan done today is mostly likely a large pleural effusion. In the past he had a moderate pleural effusion, which looks much larger at this moment, and I think he is just getting hypoxemic and really tired due to his congestive heart failure and new pleural effusion.   REVIEW OF SYSTEMS: Positive fatigue. Positive weakness. No major changes in his weight. He checks his weight every day and he takes Lasix only if there is any increase in his baseline weight. EYES: Denies any blurry vision or pain in his eyes. ENT: No tinnitus. No dysphagia. No difficulties with  swallowing. No epistaxis. RESPIRATORY: No cough. No wheezing. No hemoptysis. No dyspnea. No painful respirations. He overall feels like he is breathing fine. Occasionally at night he has episodes where he is a little bit tight in his chest and once he takes the Lasix it goes away. CARDIOVASCULAR: Negative chest pain. Negative orthopnea. Negative arrhythmias. Negative for palpitations or syncope. GASTROINTESTINAL: No nausea, vomiting, or diarrhea. GENITOURINARY: No dysuria, hematuria, or increased frequency.  ENDOCRINE: No polyuria, polydipsia, or polyphagia. No thyroid problems or cold or heat intolerance. HEME/LYMPH: No significant anemia, bleeding, or swollen glands. MUSCULOSKELETAL: No neck pain, back pain, or other joint pain. No gout. NEURO: No cerebrovascular accident, no transient ischemic attack. No significant numbness or ataxia.  PSYCHIATRIC: No significant insomnia, depression, or agitation.   PAST MEDICAL HISTORY:  1. Congestive heart failure, systolic and diastolic with an ejection fraction around 45%.  2. History of pulmonary edema.  3. Pulmonary hypertension.  4. Chronic atrial fibrillation.  5. Chronic anticoagulation.  6. Coronary artery disease, medical management.  7. Hyperlipidemia.  8. Hypertension.  9. Gastroesophageal reflux disease. 10. History of prostate cancer.  11. Chronic anemia.  12. History of skin cancer.   PAST SURGICAL HISTORY:  1. Tonsillectomy and adenoidectomy. 2. Partial resection of the thyroid, non-malignant. 3. Hemorrhoidectomy.  4. Appendectomy.   ALLERGIES: No known drug allergies.   FAMILY HISTORY: Hypertension. Negative for coronary artery disease. Negative for cancer.   SOCIAL HISTORY: The patient lives at the [facility] with his wife. 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 He does not drink.   CURRENT MEDICATIONS:  1. Zocor 40 mg once a day.  2. Warfarin 4 mg once a day.  3. Protonix 40 mg once a day. 4. Niacin 750  mg once a day.  5. Multivitamins.  6. Lisinopril 5 mg once daily.  7. Klor-Con 10 mEq once a day.  8. Hydrochlorothiazide 25 mg once a day.  9. Furosemide 20 mg p.r.n. increased weight.  10. FiberChoice daily.  11. Ferrous gluconate 325 mg two times a day.  12. Diltiazem 120 mg every 24 hours, once daily.  13. Digoxin 250 mcg once daily.   PHYSICAL EXAMINATION:  VITAL SIGNS: Blood pressure 138/78, pulse 90, respiratory rate 20, oxygen saturation on room air by walking 92%, temperature 97.4.   GENERAL: The patient is alert, oriented times three. No acute distress. No respiratory distress. Hemodynamically stable. Very pleasant.   HEENT: Pupils are equal and reactive. Extraocular movements are intact. Mucosa moist. No oropharyngeal exudates.  No masses in the throat.   NECK: Supple. No JVD. No thyromegaly. No adenopathy. No carotid bruits.   LUNGS: Decreased respiratory sounds in both bases, right more than left. He also has mild crepitus in middle lung fields. There is no rhonchi, no wheezing, no use of accessory muscles.   ABDOMEN: Soft, nontender, nondistended. No hepatosplenomegaly. No masses. Bowel sounds are positive.   EXTREMITIES: Positive edema +2 pitting, pretibial pulses +2. Capillary refill less than 3 seconds.   GENITAL: Deferred.    MUSCULOSKELETAL: No significant joint abnormalities or joint effusions.   SKIN: No rashes or petechiae.   NEUROLOGIC: Cranial nerves II through XII intact. No focal deficits.   PSYCHIATRIC: Negative for anxiety or agitation. The patient is very pleasant and alert, oriented times three.   LYMPHATIC: Negative for lymphadenopathy in the neck or supraclavicular areas.   LABORATORY, DIAGNOSTIC, AND RADIOLOGICAL DATA:  CT scan as mentioned above, bilateral pleural effusions, mild pulmonary edema. Glucose 138, potassium 4, sodium 144, BUN 31,  bilirubin 1.7. Other LFTs within normal limits. Troponin 0.35. Hemoglobin 12.6, white count 6.9,  platelets 150, MCH, MCV mildly elevated. Urinalysis with 228 red blood cells, one white blood cell, likely traumatic from I and O.   EKG: Atrial fibrillation with rate controlled. No ST depression or elevation.   ASSESSMENT AND PLAN: 81 year old gentleman with history of congestive heart failure, systolic and diastolic, hypertension, atrial fibrillation, dyslipidemia, and coronary artery disease who comes in with a history of being progressively weak to the point today that he almost fainted. Chest x-ray and CT scan showed mostly pleural effusions that are large, likely due to his congestive heart failure.  1. Near syncope likely due to his congestive heart failure history.  I think at this moment he is mildly decompensated and his pleural effusions might be creating most of the problem if the patient can get hypoxic with ambulation. At this time we have tried to create hypoxia with ambulation here in the ER but his oxygen saturation came down just to 92, which I think is enough to make him feel chronically tired. He is overall hemodynamically stable.  2. Pleural effusions. These pleural effusions are likely due to his congestive heart failure. I offered the possibility of getting a thoracenteses versus just being more aggressive with the Lasix. He wants to try the Lasix approach first and we are going to do 40 mg twice daily. We are going to monitor  his creatinine closely and replace his potassium as needed. The patient will consider thoracentesis if necessary. At this moment he is on room air and he is not in any respiratory distress; I think that thoracentesis is not a necessary procedure to do at the moment.  3. Congestive heart failure, systolic and diastolic. The patient is taking Lasix only on a weight basis if his weight increases. He is very knowledgeable about his condition and what to do. He is knowledgeable about his diet and he follows through with all recommendations, but I think that he just  needs a little bit more aggressive management with his Lasix. We are going request a cardiology consultation and see if Dr. Rockey Situ can see him and evaluate him here. He is in mild chronic obstructive pulmonary disease decompensation, showing on the CT scan mild pulmonary edema. He also has edema of the lower extremities.  4. Coronary artery disease, stable. Continue medical management.  5. Atrial fibrillation. The patient has rate control. Continue digoxin and diltiazem.  6. Hypertension. Well controlled as well. Continue digoxin and hydrochlorothiazide. 7. Dyslipidemia. Continue use of statin.  The patient also takes lisinopril for his congestive heart failure since he has systolic and diastolic at the same time.  8. Anticoagulation with warfarin. Check a PT/INR.  9. GI prophylaxis with Protonix.  10. Patient is a FULL CODE.   TIME SPENT: I spent 55 minutes with this patient today.    ____________________________ [place] Sink, MD rsg:bjt D: 09/23/2012 22:42:07 ET T: 09/24/2012 08:37:29 ET JOB#: [id number]  cc: [place] Sink, MD, <Dictator> Deborra Medina, MD Cristi Loron MD ELECTRONICALLY SIGNED 09/24/2012 14:06

## 2015-02-12 NOTE — Discharge Summary (Signed)
PATIENT NAME:  Bruce Vaughan, DOLMAN MR#:  [id number] DATE OF BIRTH:  1921/10/18  DATE OF ADMISSION:  10/22/2012 DATE OF DISCHARGE:  10/24/2012  NOTE:  Possible transfer to [facility] on 10/24/2012.   REASON FOR TRANSFER: Patient with critical aortic stenosis with a mean gradient of 58 and valve area of 0.32.  Possibility of undergoing a balloon angioplasty of the aortic valve versus any other surgical procedures.   DISPOSITION: Onset: Kathlyn Sacramento, MD  NEPHROLOGIST: Anthonette Legato, MD  HOSPITALIST: Hilma Favors, MD  ACCEPTING PHYSICIAN: Dr. Alycia Rossetti    CURRENT MEDICATIONS: Ferrous gluconate 250 mg twice daily, metoprolol 25 mg twice daily,  Nystatin oral for thrush, Protonix 40 mg every day, simvastatin 40 mg daily, furosemide 40 mg IV twice daily.   IMPORTANT LABORATORY, DIAGNOSTIC AND RADIOLOGICAL DATA:   Echocardiogram showing a left ventricle is mildly dilated, left ventricular systolic function is severely reduced at 15 to 20% from a previous echo done in October 2013 when his ejection fraction was 35% to 45%. At that moment in October 2013, his aortic valve was not well visualized, but there was no hemodynamically significant valvular aortic stenosis that was visualized. Now on this repeat echocardiogram, he has severely calcified aortic valve with critical aortic stenosis with a mean gradient of 58 and a valve area of 0.32. There is also Doppler flow pattern suggestive of restrictive physiology or diastolic failure. There is mild concentric left ventricular hypertrophy; right ventricular systolic function is reduced. There is moderate-to-severe mitral regurgitation, mild tricuspid regurgitation, right ventricular systolic pressures in between 50 to 60.  His EKG showed atrial fibrillation with a ventricular rate around 89.  His chest x-ray showed congestive heart failure with bilateral pulmonary infiltrates and pulmonary venous congestion.   He had a BNP of  63,000 at admission, a glucose of 140, and a creatinine of 2.14. Previous creatinine was 1.2 on December 2nd, and he had now a creatinine of 2.26.  His other electrolytes were within normal limits. His albumin was 2.4.  AST was slightly elevated at 275. Troponin was 1.5, and a CPK-MB was 4.0.  The highest the troponin was at admission was 2 and came down to 1.5. His hemoglobin is 14.3, white count is 9.8, and his platelet count is 147.  PT 37, INR 3.8. No signs of bleeding, for which his Coumadin has been held. Random protein is 51 in urine, and urine creatinine is 1.84. His urinalysis did not show any signs of urine infection.   HOSPITAL COURSE: The patient is a very nice 81 year old gentleman who presented to the Emergency Department on the date of admission, 10/22/2012, due to poor p.o. intake, lethargy, and generalized weakness. He also had complaints of worsening exertional shortness of breath and a 5 to 6-pound weight gain over the past 2 months. The patient has been taking Lasix, and his kidney function started to worsen, for which he was changed to Bumex. Whenever he showed to the ER, his Digoxin levels were mildly elevated, for which he got a dose of Tg fat antibody. The patient was admitted and diagnosed with acute kidney injury, likely due to dehydration and overdiuresis.   1. Acute kidney failure: The patient's baseline was 0.9, and it has been increased up to 2.14, likely due to poor p.o. intake and dehydration at the moment of admission, although now that we have results of his echocardiogram, it is very likely that this is just due to decreased perfusion due to his critical aortic  stenosis. The patient was put on gentle IV fluids, and those were given to the patient slowly. He is starting to get a little bit of increased congestion after a day for which they were stopped. The patient was given Lasix today. The patient had slight improvement of his kidney function, but again this is likely due to  lack of perfusion due to his severe and critical aortic stenosis.  2. Critical aortic stenosis: The patient has been counseled  the only 2 things to do at this moment are either comfort care and go to hospice or go for surgical management. He has been offered to be transferred to another hospital, likely [place], for balloon angioplasty of the aortic valve and possible intravascular aortic replacement in the near future. The patient really wants to pursue this. Dr. Fletcher Anon has been making arrangements for this. He spoke with Dr. Alycia Rossetti, who is the accepting physician at [facility] upon bed availability.  3. Elevated troponin: Likely due to his aortic stenosis, and lack of proper coronary flow, as well as increased demand due to the aortic stenosis. The patient has been doing okay. He is anticoagulated with Coumadin. His INR is 3.8 still, supratherapeutic, for which his Coumadin has been held. No other interventions have been arranged by Cardiology.  4. History of diastolic and systolic heart failure: The patient had a significant drop of his ejection fraction from 35% to 45% down to 15% now due to his critical aortic stenosis. We are going to avoid vasodilators like hydralazine and nitroglycerin for now. The patient is on a beta blocker to improve diastolic filling.  5. Chronic atrial fibrillation: The patient is on rate control. His INR is supratherapeutic, for which his Coumadin is on hold. No need for vitamin VK or FFP since the patient does not have any bleeding.  6. Hyperlipidemia: Continue statin. 7. Gastroesophageal reflux: The patient is on Protonix.  8. Oral thrush: The patient has Nystatin.   TIME SPENT: I spent about 75 minutes with this patient today.   ____________________________ [place] Sink, MD rsg:cb D: 10/24/2012 15:04:00 ET T: 10/24/2012 15:22:12 ET JOB#: [id number]  cc: [place] Sink, MD, <Dictator> Carlotta Telfair America Brown MD ELECTRONICALLY SIGNED 11/01/2012 13:34

## 2017-07-18 NOTE — Telephone Encounter (Signed)
Error
